# Patient Record
Sex: Male | Born: 1955 | Race: White | Hispanic: No | Marital: Married | Smoking: Former smoker
Health system: Southern US, Community
[De-identification: ages and names within clinical notes are randomized; demographics above are authoritative.]

## PROBLEM LIST (undated history)

## (undated) DIAGNOSIS — G2581 Restless legs syndrome: Secondary | ICD-10-CM

## (undated) DIAGNOSIS — E7800 Pure hypercholesterolemia, unspecified: Secondary | ICD-10-CM

## (undated) DIAGNOSIS — R519 Headache, unspecified: Secondary | ICD-10-CM

## (undated) DIAGNOSIS — K219 Gastro-esophageal reflux disease without esophagitis: Secondary | ICD-10-CM

## (undated) HISTORY — DX: Headache, unspecified: R51.9

## (undated) HISTORY — PX: HERNIA REPAIR: SHX51

## (undated) HISTORY — PX: BACK SURGERY: SHX140

## (undated) HISTORY — PX: SHOULDER SURGERY: SHX246

---

## 2007-11-17 ENCOUNTER — Encounter: Admission: RE | Admit: 2007-11-17 | Discharge: 2008-01-04 | Payer: Self-pay | Admitting: Unknown Physician Specialty

## 2009-10-30 ENCOUNTER — Encounter
Admission: RE | Admit: 2009-10-30 | Discharge: 2010-01-30 | Payer: Self-pay | Source: Home / Self Care | Admitting: Orthopedic Surgery

## 2010-01-31 ENCOUNTER — Encounter
Admission: RE | Admit: 2010-01-31 | Discharge: 2010-03-06 | Payer: Self-pay | Source: Home / Self Care | Attending: Orthopedic Surgery | Admitting: Orthopedic Surgery

## 2015-03-10 DIAGNOSIS — I639 Cerebral infarction, unspecified: Secondary | ICD-10-CM

## 2015-03-10 HISTORY — DX: Cerebral infarction, unspecified: I63.9

## 2015-06-27 ENCOUNTER — Encounter (HOSPITAL_COMMUNITY): Payer: Self-pay

## 2015-06-27 ENCOUNTER — Inpatient Hospital Stay (HOSPITAL_COMMUNITY)
Admission: EM | Admit: 2015-06-27 | Discharge: 2015-06-29 | DRG: 066 | Disposition: A | Discharge status: Home / Self Care | Payer: BLUE CROSS/BLUE SHIELD | Attending: Internal Medicine | Admitting: Internal Medicine

## 2015-06-27 ENCOUNTER — Emergency Department (HOSPITAL_COMMUNITY): Payer: BLUE CROSS/BLUE SHIELD

## 2015-06-27 DIAGNOSIS — R51 Headache: Secondary | ICD-10-CM

## 2015-06-27 DIAGNOSIS — I63532 Cerebral infarction due to unspecified occlusion or stenosis of left posterior cerebral artery: Secondary | ICD-10-CM | POA: Diagnosis not present

## 2015-06-27 DIAGNOSIS — R519 Headache, unspecified: Secondary | ICD-10-CM | POA: Diagnosis present

## 2015-06-27 DIAGNOSIS — F17210 Nicotine dependence, cigarettes, uncomplicated: Secondary | ICD-10-CM | POA: Diagnosis present

## 2015-06-27 DIAGNOSIS — H5461 Unqualified visual loss, right eye, normal vision left eye: Secondary | ICD-10-CM | POA: Diagnosis present

## 2015-06-27 DIAGNOSIS — H539 Unspecified visual disturbance: Secondary | ICD-10-CM

## 2015-06-27 DIAGNOSIS — I639 Cerebral infarction, unspecified: Secondary | ICD-10-CM | POA: Diagnosis present

## 2015-06-27 DIAGNOSIS — H53461 Homonymous bilateral field defects, right side: Secondary | ICD-10-CM | POA: Diagnosis present

## 2015-06-27 LAB — COMPREHENSIVE METABOLIC PANEL
ALBUMIN: 4.1 g/dL (ref 3.5–5.0)
ALT: 19 U/L (ref 17–63)
ANION GAP: 9 (ref 5–15)
AST: 18 U/L (ref 15–41)
Alkaline Phosphatase: 66 U/L (ref 38–126)
BILIRUBIN TOTAL: 0.3 mg/dL (ref 0.3–1.2)
BUN: 17 mg/dL (ref 6–20)
CHLORIDE: 105 mmol/L (ref 101–111)
CO2: 25 mmol/L (ref 22–32)
Calcium: 8.7 mg/dL — ABNORMAL LOW (ref 8.9–10.3)
Creatinine, Ser: 1.08 mg/dL (ref 0.61–1.24)
GFR calc Af Amer: >60 mL/min (ref >60)
GFR calc non Af Amer: >60 mL/min (ref >60)
GLUCOSE: 94 mg/dL (ref 65–99)
POTASSIUM: 3.8 mmol/L (ref 3.5–5.1)
Sodium: 139 mmol/L (ref 135–145)
TOTAL PROTEIN: 6.7 g/dL (ref 6.5–8.1)

## 2015-06-27 LAB — URINALYSIS, ROUTINE W REFLEX MICROSCOPIC
Bilirubin Urine: NEGATIVE
GLUCOSE, UA: NEGATIVE mg/dL
Hgb urine dipstick: NEGATIVE
Ketones, ur: NEGATIVE mg/dL
LEUKOCYTES UA: NEGATIVE
Nitrite: NEGATIVE
PH: 6 (ref 5.0–8.0)
Protein, ur: NEGATIVE mg/dL
Specific Gravity, Urine: <1.005 — ABNORMAL LOW (ref 1.005–1.030)

## 2015-06-27 LAB — CBC
HCT: 41.8 % (ref 39.0–52.0)
HEMOGLOBIN: 14.2 g/dL (ref 13.0–17.0)
MCH: 30.7 pg (ref 26.0–34.0)
MCHC: 34 g/dL (ref 30.0–36.0)
MCV: 90.3 fL (ref 78.0–100.0)
PLATELETS: 399 10*3/uL (ref 150–400)
RBC: 4.63 MIL/uL (ref 4.22–5.81)
RDW: 13.5 % (ref 11.5–15.5)
WBC: 9 10*3/uL (ref 4.0–10.5)

## 2015-06-27 LAB — I-STAT TROPONIN, ED: TROPONIN I, POC: 0 ng/mL (ref 0.00–0.08)

## 2015-06-27 LAB — I-STAT CHEM 8, ED
BUN: 17 mg/dL (ref 6–20)
Calcium, Ion: 1.07 mmol/L — ABNORMAL LOW (ref 1.12–1.23)
Chloride: 103 mmol/L (ref 101–111)
Creatinine, Ser: 1.1 mg/dL (ref 0.61–1.24)
Glucose, Bld: 89 mg/dL (ref 65–99)
HEMATOCRIT: 44 % (ref 39.0–52.0)
HEMOGLOBIN: 15 g/dL (ref 13.0–17.0)
POTASSIUM: 3.7 mmol/L (ref 3.5–5.1)
SODIUM: 140 mmol/L (ref 135–145)
TCO2: 23 mmol/L (ref 0–100)

## 2015-06-27 LAB — RAPID URINE DRUG SCREEN, HOSP PERFORMED
Amphetamines: NOT DETECTED
Barbiturates: NOT DETECTED
Benzodiazepines: NOT DETECTED
COCAINE: NOT DETECTED
OPIATES: POSITIVE — AB
TETRAHYDROCANNABINOL: NOT DETECTED

## 2015-06-27 LAB — ETHANOL

## 2015-06-27 LAB — DIFFERENTIAL
BASOS PCT: 0 %
Basophils Absolute: 0 10*3/uL (ref 0.0–0.1)
EOS ABS: 0.1 10*3/uL (ref 0.0–0.7)
EOS PCT: 1 %
LYMPHS ABS: 3.2 10*3/uL (ref 0.7–4.0)
Lymphocytes Relative: 35 %
Monocytes Absolute: 0.7 10*3/uL (ref 0.1–1.0)
Monocytes Relative: 8 %
NEUTROS PCT: 56 %
Neutro Abs: 5 10*3/uL (ref 1.7–7.7)

## 2015-06-27 LAB — PROTIME-INR
INR: 1.02 (ref 0.00–1.49)
PROTHROMBIN TIME: 13.6 s (ref 11.6–15.2)

## 2015-06-27 LAB — APTT: APTT: 25 s (ref 24–37)

## 2015-06-27 MED ORDER — IOHEXOL 350 MG/ML SOLN
75.0000 mL | Freq: Once | INTRAVENOUS | Status: AC | PRN
  Administered 2015-06-27: 75 mL via INTRAVENOUS

## 2015-06-27 MED ORDER — IOHEXOL 350 MG/ML SOLN: 100.0000 mL | Freq: Once | INTRAVENOUS | Status: DC | PRN

## 2015-06-27 MED ORDER — MORPHINE SULFATE (PF) 4 MG/ML IV SOLN
4.0000 mg | Freq: Once | INTRAVENOUS | Status: AC
  Administered 2015-06-27: 4 mg via INTRAVENOUS
  Filled 2015-06-27: qty 1

## 2015-06-27 MED ORDER — ONDANSETRON HCL 4 MG/2ML IJ SOLN
4.0000 mg | Freq: Once | INTRAMUSCULAR | Status: AC
  Administered 2015-06-27: 4 mg via INTRAVENOUS
  Filled 2015-06-27: qty 2

## 2015-06-27 NOTE — ED Notes (Signed)
MD at the bedside, wife and sister in law in the room

## 2015-06-27 NOTE — ED Notes (Signed)
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  He was taken to a nearby hospital and they did not do anything. He is weak, unsteady, and his vision is off.  Complaining of a headache.  Right side peripheral vision is gone. Stopped to take a break on Tuesday and I walked back to the truck and it happened all of a sudden.  They did a head CT, told me my potassium was low, and gave me fluids.  They sent a team out to pick up myself and my truck.

## 2015-06-27 NOTE — ED Provider Notes (Addendum)
CSN: [id number]     Arrival date & time 06/27/15  2052 History  By signing my name below, I, Helane Gunther, attest that this documentation has been prepared under the direction and in the presence of Tanna Furry, MD. Electronically Signed: Helane Gunther, ED Scribe. 06/27/2015. 9:40 PM.     Chief Complaint  Patient presents with  . Headache  . Visual Field Change   The history is provided by the patient, the spouse and a relative. No language interpreter was used.   HPI Comments: Russell Richard is a 60 y.o. male smoker at 1 ppd who presents to the Emergency Department complaining of a left-sided frontal headache and right-sided loss of vision onset 2 days ago. 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 He notes he "couldn't think" and dind't know where he was, except that he was in the truck. He states another truck driver came up to him. Per wife, pt called her and was rambling, unable to tell her where he was, but notes that pt is now back to normal with normal memory function. She notes she called the trucking company to find out where pt was. Pt then called EMS and was taken to the hospital by EMS and underwent extensive testing and imaging, after which he was discharged with a packet of medical papers and told his potassium was very low. He was taken back home by another driver from the same company and met his family in the ED parking lot. He denies any previous episodes of the same. He reports he has noticed a gait problem after loosing his vision on Tuesday. He denies a PMHx of HTN, DM, heart disease, stroke, and seizures. He does endorse a PSHx of back and shoulder surgery, and reports pain in the left shoulder at baseline. Pt denies nausea with either episode. He also denies neck or back pain.    History reviewed. No pertinent past medical history. Past Surgical History  Procedure  Laterality Date  . Back surgery    . Shoulder surgery     No family history on file. Social History  Substance Use Topics  . Smoking status: Current Every Day Smoker -- 1.00 packs/day    Types: Cigarettes  . Smokeless tobacco: None  . Alcohol Use: No    Review of Systems  Constitutional: Negative for chills, diaphoresis and appetite change.  HENT: Negative for mouth sores and trouble swallowing.   Eyes: Positive for visual disturbance.  Respiratory: Negative for chest tightness, shortness of breath and wheezing.   Cardiovascular: Negative for chest pain.  Gastrointestinal: Negative for nausea and abdominal distention.  Endocrine: Negative for polydipsia, polyphagia and polyuria.  Genitourinary: Negative for dysuria, frequency and hematuria.  Musculoskeletal: Positive for gait problem. Negative for back pain and neck pain.  Skin: Negative for color change, pallor and rash.  Neurological: Positive for headaches. Negative for syncope and light-headedness.  Hematological: Does not bruise/bleed easily.  Psychiatric/Behavioral: Positive for confusion (resolved). Negative for behavioral problems.    Allergies  Review of patient's allergies indicates no known allergies.  Home Medications   Prior to Admission medications   Medication Sig Start Date End Date Taking? Authorizing Provider  esomeprazole (NEXIUM) 20 MG capsule Take 20 mg by mouth 5 (five) times daily.    Yes Historical Provider, MD  OVER THE COUNTER MEDICATION Take  3 capsules by mouth daily. [place]   Yes Historical Provider, MD  traZODone (DESYREL) 50 MG tablet Take 50 mg by mouth at bedtime.   Yes Historical Provider, MD   BP 126/80 mmHg  Pulse 75  Temp(Src) 97.9 F (36.6 C) (Oral)  Resp 18  Ht _0  (1.803 m)  Wt 200 lb (90.719 kg)  BMI 27.91 kg/m2  SpO2 96% Physical Exam  Constitutional: He is oriented to person, place, and time. He appears well-developed and well-nourished. No distress.  HENT:  Head:  Normocephalic.  Eyes: Conjunctivae are normal. Pupils are equal, round, and reactive to light. No scleral icterus.  Right homonymous hemianopsia   Neck: Normal range of motion. Neck supple. No thyromegaly present.  Cardiovascular: Normal rate and regular rhythm.  Exam reveals no gallop and no friction rub.   No murmur heard. Pulmonary/Chest: Effort normal and breath sounds normal. No respiratory distress. He has no wheezes. He has no rales.  Abdominal: Soft. Bowel sounds are normal. He exhibits no distension. There is no tenderness. There is no rebound.  Musculoskeletal: Normal range of motion.  Neurological: He is alert and oriented to person, place, and time.  Skin: Skin is warm and dry. No rash noted.  Psychiatric: He has a normal mood and affect. His behavior is normal.    ED Course  Procedures  DIAGNOSTIC STUDIES: Oxygen Saturation is 98% on RA, normal by my interpretation.    COORDINATION OF CARE: 9:38 PM - Discussed normal EKG results and plans to repeat a CT scan. Will order something for pain and nausea. Advised pt he may need an MRI. Pt advised of plan for treatment and pt agrees.  Labs Review Labs Reviewed  COMPREHENSIVE METABOLIC PANEL - Abnormal; Notable for the following:    Calcium 8.7 (*)    All other components within normal limits  I-STAT CHEM 8, ED - Abnormal; Notable for the following:    Calcium, Ion 1.07 (*)    All other components within normal limits  ETHANOL  PROTIME-INR  APTT  CBC  DIFFERENTIAL  URINE RAPID DRUG SCREEN, HOSP PERFORMED  URINALYSIS, ROUTINE W REFLEX MICROSCOPIC (NOT AT [facility])  I-STAT TROPOININ, ED    Imaging Review Ct Head Wo Contrast  06/27/2015  CLINICAL DATA:  Headache and visual changes for 2 days, episode of confusion Tuesday, headaches since, unable to see to the RIGHT side, history smoking EXAM: CT HEAD WITHOUT CONTRAST TECHNIQUE: Contiguous axial images were obtained from the base of the skull through the vertex without  intravenous contrast. COMPARISON:  None FINDINGS: Normal ventricular morphology. No midline shift or mass effect. Large area of low-attenuation at the LEFT occipital lobe consistent with an acute to subacute infarct. No midline shift. No additional focal parenchymal brain abnormalities, hemorrhage, or mass lesion. Atherosclerotic calcification at the carotid siphons. Osseous structures unremarkable. Paranasal sinuses and mastoid air cells clear. IMPRESSION: Acute to subacute infarct involving the LEFT occipital lobe. Electronically Signed   By: Lavonia Dana M.D.   On: 06/27/2015 22:05   I have personally reviewed and evaluated these images and lab results as part of my medical decision-making.   EKG Interpretation None      MDM   Final diagnoses:  Cerebral infarction due to unspecified mechanism  Homonymous hemianopsia, right    CT shows left occipital large volume area of hypodensity consistent with infarct. This would explain his homonymous hemianopsia. CT angiogram of the head and neck ordered to rule out vertebrobasilar dissection considering the patient's headache.  Discussed the case with Dr. Shanon Brow. Also Dr. Wendee Beavers neurology [facility]. Dr. Wendee Beavers felt that if CT angiogram was normal, the patient could complete a stroke evaluation inpatient here, ie would not require specific studies at [facility].   I personally performed the services described in this documentation, which was scribed in my presence. The recorded information has been reviewed and is accurate.   Tanna Furry, MD 06/27/15 [place], MD 06/27/15 2337

## 2015-06-27 NOTE — ED Notes (Signed)
Pt has loss of peripheral vision on right, complaining of throbbing headache in fore head region. Other neuro check in tact

## 2015-06-28 ENCOUNTER — Observation Stay (HOSPITAL_COMMUNITY): Payer: BLUE CROSS/BLUE SHIELD

## 2015-06-28 ENCOUNTER — Encounter (HOSPITAL_COMMUNITY): Payer: Self-pay | Admitting: *Deleted

## 2015-06-28 DIAGNOSIS — H53461 Homonymous bilateral field defects, right side: Secondary | ICD-10-CM | POA: Diagnosis present

## 2015-06-28 DIAGNOSIS — I63532 Cerebral infarction due to unspecified occlusion or stenosis of left posterior cerebral artery: Secondary | ICD-10-CM | POA: Diagnosis present

## 2015-06-28 DIAGNOSIS — I6789 Other cerebrovascular disease: Secondary | ICD-10-CM

## 2015-06-28 DIAGNOSIS — F17210 Nicotine dependence, cigarettes, uncomplicated: Secondary | ICD-10-CM | POA: Diagnosis present

## 2015-06-28 DIAGNOSIS — H5461 Unqualified visual loss, right eye, normal vision left eye: Secondary | ICD-10-CM | POA: Diagnosis not present

## 2015-06-28 DIAGNOSIS — R519 Headache, unspecified: Secondary | ICD-10-CM | POA: Diagnosis present

## 2015-06-28 DIAGNOSIS — I639 Cerebral infarction, unspecified: Secondary | ICD-10-CM | POA: Diagnosis not present

## 2015-06-28 DIAGNOSIS — R51 Headache: Secondary | ICD-10-CM

## 2015-06-28 DIAGNOSIS — I63012 Cerebral infarction due to thrombosis of left vertebral artery: Secondary | ICD-10-CM | POA: Diagnosis not present

## 2015-06-28 DIAGNOSIS — I63032 Cerebral infarction due to thrombosis of left carotid artery: Secondary | ICD-10-CM

## 2015-06-28 LAB — LIPID PANEL
CHOL/HDL RATIO: 5.6 ratio
CHOLESTEROL: 169 mg/dL (ref 0–200)
HDL: 30 mg/dL — ABNORMAL LOW (ref >40)
LDL CALC: 84 mg/dL (ref 0–99)
TRIGLYCERIDES: 273 mg/dL — AB (ref <150)
VLDL: 55 mg/dL — AB (ref 0–40)

## 2015-06-28 LAB — GLUCOSE, CAPILLARY
GLUCOSE-CAPILLARY: 126 mg/dL — AB (ref 65–99)
Glucose-Capillary: 145 mg/dL — ABNORMAL HIGH (ref 65–99)
Glucose-Capillary: 92 mg/dL (ref 65–99)

## 2015-06-28 LAB — ECHOCARDIOGRAM COMPLETE
Height: 71 in
Weight: 3200 oz

## 2015-06-28 MED ORDER — STROKE: EARLY STAGES OF RECOVERY BOOK
Freq: Once | Status: DC
Start: 2015-06-28 — End: 2015-06-29
  Filled 2015-06-28: qty 1

## 2015-06-28 MED ORDER — ASPIRIN 300 MG RE SUPP: 300.0000 mg | Freq: Every day | RECTAL | Status: DC

## 2015-06-28 MED ORDER — ACETAMINOPHEN 325 MG PO TABS
650.0000 mg | ORAL_TABLET | Freq: Four times a day (QID) | ORAL | Status: DC | PRN
  Administered 2015-06-28 (×2): 650 mg via ORAL
  Filled 2015-06-28 (×2): qty 2

## 2015-06-28 MED ORDER — ASPIRIN 325 MG PO TABS
325.0000 mg | ORAL_TABLET | Freq: Every day | ORAL | Status: DC
  Administered 2015-06-28 – 2015-06-29 (×2): 325 mg via ORAL
  Filled 2015-06-28 (×2): qty 1

## 2015-06-28 MED ORDER — ASPIRIN 81 MG PO CHEW
324.0000 mg | CHEWABLE_TABLET | Freq: Once | ORAL | Status: AC
  Administered 2015-06-28: 324 mg via ORAL
  Filled 2015-06-28: qty 4

## 2015-06-28 MED ORDER — MORPHINE SULFATE (PF) 4 MG/ML IV SOLN
4.0000 mg | Freq: Once | INTRAVENOUS | Status: AC
  Administered 2015-06-28: 4 mg via INTRAVENOUS
  Filled 2015-06-28: qty 1

## 2015-06-28 NOTE — Care Management Note (Signed)
Case Management Note  Patient Details  Name: Russell Richard MRN: [id number] Date of Birth: February 10, 1956  Subjective/Objective:     From home and independent living with spouse.  Drives. CVA has presented with right Periferal vision loss.  Weakness in arms legs resolving. No recommendations for home PT or REHAB.  Insured patient that can afford meds. No Cm needs identified.           Action/Plan:Home with Self care.   Expected Discharge Date:  07/01/15               Expected Discharge Plan:  Home/Self Care  In-House Referral:     Discharge planning Services  CM Consult, Homebound not met per provider  Post Acute Care Choice:    Choice offered to:     DME Arranged:    DME Agency:     HH Arranged:    HH Agency:     Status of Service:  Completed, signed off  Medicare Important Message Given:    Date Medicare IM Given:    Medicare IM give by:    Date Additional Medicare IM Given:    Additional Medicare Important Message give by:     If discussed at [place] of Stay Meetings, dates discussed:    Additional Comments:  Alvie Heidelberg, RN 06/28/2015, 3:59 PM

## 2015-06-28 NOTE — ED Provider Notes (Signed)
1:38 AM  D/w Dr. Cena BentonVega with neurology. Discussed CTA imaging findings. States that there is nothing that can be done for the P3 occlusion seen. He agrees that patient can stay here at [facility]. Discussed with Dr. Onalee Huaavid with hospital service who agrees on admission to telemetry, observation. We'll give a full dose aspirin.  Layla MawKristen N Kla Bily, DO 06/28/15 0140

## 2015-06-28 NOTE — H&P (Signed)
PCP:   No primary care provider on file.   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  He pulled over the truck called his company who called 911.  He went to local ED there via ambulance.  Pt reports he was not admitted, a ct head was done which he was told was normal and he was sent home.  When he left there, he was still having right eye blindness and the headache.  He took a cab to his truck, his company sent someone to get him and he arrived back in [place] today.  He continued to be worried and his wife was as well, so they came to the ER here.  Pt reports he has been having some imbalance issues due to the new blindness in the right eye.  He does not take an aspirin daily and was not instructed to take aspirin by the ED 2 days ago.  He denies any facial drooping, no numbnness /tingling in the face or any extremeties.  No weakness in his arms/legs.  No numbness anywhere.  Headache is frontal and bilateral.  Pt ct head today shows acute infarct.  Pt referred for admission for CVA work up.  [facility] who recommended that there was nothing more aggressive that could be done [facility] than [facility] (cta shows left P3 occlusion).  So referred for neuro work up here at [facility].  Review of Systems:  Positive and negative as per HPI otherwise all other systems are negative  Past Medical History: History reviewed. No pertinent past medical history. Past Surgical History  Procedure Laterality Date  . Back surgery    . Shoulder surgery      Medications: Prior to Admission medications   Medication Sig Start Date End Date Taking? Authorizing Provider  esomeprazole (NEXIUM) 20 MG capsule Take 20 mg by mouth 5 (five) times daily.    Yes Historical  Provider, MD  OVER THE COUNTER MEDICATION Take 3 capsules by mouth daily. [place]EN[place]   Yes Historical Provider, MD  traZODone (DESYREL) 50 MG tablet Take 50 mg by mouth at bedtime.   Yes Historical Provider, MD    Allergies:  No Known Allergies  Social History:  reports that he has been smoking Cigarettes.  He has been smoking about 1.00 pack per day. He does not have any smokeless tobacco history on file. He reports that he does not drink alcohol or use illicit drugs.  Family History: No premature CAD  Physical Exam: Filed Vitals:   06/27/15 2335 06/28/15 0000 06/28/15 0030 06/28/15 0100  BP: 120/87 119/71 115/80 114/75  Pulse: 74 71 68 66  Temp:      TempSrc:      Resp: 13 18 14 16   Height:      Weight:      SpO2: 94% 95% 94% 94%   General appearance: alert, cooperative and no distress Head: Normocephalic, without obvious abnormality, atraumatic Eyes: negative Nose: Nares normal. Septum midline. Mucosa normal. No drainage or sinus tenderness. Neck: no JVD and supple, symmetrical, trachea midline Lungs: clear to auscultation bilaterally Heart: regular rate and rhythm, S1, S2 normal, no murmur, click, rub or gallop Abdomen: soft, non-tender; bowel sounds normal; no masses,  no organomegaly Extremities:  extremities normal, atraumatic, no cyanosis or edema Pulses: 2+ and symmetric Skin: Skin color, texture, turgor normal. No rashes or lesions Neurologic: Grossly normal   Labs on Admission:   Recent Labs  06/27/15 2120 06/27/15 2130  NA 139 140  K 3.8 3.7  CL 105 103  CO2 25  --   GLUCOSE 94 89  BUN 17 17  CREATININE 1.08 1.10  CALCIUM 8.7*  --     Recent Labs  06/27/15 2120  AST 18  ALT 19  ALKPHOS 66  BILITOT 0.3  PROT 6.7  ALBUMIN 4.1    Recent Labs  06/27/15 2120 06/27/15 2130  WBC 9.0  --   NEUTROABS 5.0  --   HGB 14.2 15.0  HCT 41.8 44.0  MCV 90.3  --   PLT 399  --     Radiological Exams on Admission: Ct Angio Head W/cm &/or Wo  Cm  06/27/2015  CLINICAL DATA:  Loss of RIGHT peripheral vision, throbbing frontal headache. Follow-up LEFT stroke. EXAM: CT ANGIOGRAPHY HEAD AND NECK TECHNIQUE: Multidetector CT imaging of the head and neck was performed using the standard protocol during bolus administration of intravenous contrast. Multiplanar CT image reconstructions and MIPs were obtained to evaluate the vascular anatomy. Carotid stenosis measurements (when applicable) are obtained utilizing NASCET criteria, using the distal internal carotid diameter as the denominator. CONTRAST:  75mL OMNIPAQUE IOHEXOL 350 MG/ML SOLN COMPARISON:  CT head June 27, 2015 at 2156 hours FINDINGS: CTA NECK Aortic arch: Normal appearance of the thoracic arch, normal branch pattern. Mild calcific atherosclerosis. The origins of the innominate, left Common carotid artery and subclavian artery are widely patent. Right carotid system: Common carotid artery is widely patent, coursing in a straight line fashion. Normal appearance of the carotid bifurcation without hemodynamically significant stenosis by NASCET criteria. Mild eccentric calcific atherosclerosis. Normal appearance of the included internal carotid artery. Tonsillar loop. Left carotid system: Common carotid artery is widely patent, coursing in a straight line fashion. Normal appearance of the carotid bifurcation without hemodynamically significant stenosis by NASCET criteria. Normal appearance of the included internal carotid artery. Vertebral arteries:Left vertebral artery is dominant. Eccentric calcific atherosclerosis results in mild narrowing RIGHT vertebral artery origin. Otherwise normal appearance of the vertebral arteries, which appear widely patent. Skeleton: No acute osseous process though bone windows have not been submitted. Scattered dental caries. Degenerative change of the cervical spine moderate to severe C5-6 and C6-7 neural foraminal narrowing. Other neck: Soft tissues of the neck are  non-acute though, not tailored for evaluation. CTA HEAD (venous contamination limits evaluation) Anterior circulation: Normal appearance of the cervical internal carotid arteries, petrous, cavernous and supra clinoid internal carotid arteries. Mild calcific atherosclerosis of the carotid siphons. Widely patent anterior communicating artery. Normal appearance of the anterior and middle cerebral arteries. Posterior circulation: Normal appearance of the vertebral arteries, vertebrobasilar junction and basilar artery, as well as main branch vessels. Suspected LEFT P3 segment occlusion though limited by venous contamination. No large vessel occlusion, hemodynamically significant stenosis, dissection, luminal irregularity, contrast extravasation or aneurysm within the anterior nor posterior circulation. No suspicious intracranial enhancement. Minimal enhancement of the LEFT occipital lobe infarct. IMPRESSION: CTA NECK: Mild calcific atherosclerosis resulting in mild stenosis RIGHT vertebral artery origin. No acute vascular process or hemodynamically significant stenosis. CTA HEAD: Suspected LEFT P3 occlusion though limited by mixed phase with venous contamination. No high-grade stenosis. Electronically Signed   By: Awilda Metro M.D.   On: 06/27/2015 23:23   Ct Head Wo Contrast  06/27/2015  CLINICAL DATA:  Headache and visual changes for 2 days, episode of confusion Tuesday, headaches since, unable to see to the RIGHT side, history smoking EXAM: CT HEAD WITHOUT CONTRAST TECHNIQUE: Contiguous axial images were obtained from the base of the skull through the vertex without intravenous contrast. COMPARISON:  None FINDINGS: Normal ventricular morphology. No midline shift or mass effect. Large area of low-attenuation at the LEFT occipital lobe consistent with an acute to subacute infarct. No midline shift. No additional focal parenchymal brain abnormalities, hemorrhage, or mass lesion. Atherosclerotic calcification at  the carotid siphons. Osseous structures unremarkable. Paranasal sinuses and mastoid air cells clear. IMPRESSION: Acute to subacute infarct involving the LEFT occipital lobe. Electronically Signed   By: Ulyses SouthwardMark  Boles M.D.   On: 06/27/2015 22:05   Ct Angio Neck W/cm &/or Wo/cm  06/27/2015  CLINICAL DATA:  Loss of RIGHT peripheral vision, throbbing frontal headache. Follow-up LEFT stroke. EXAM: CT ANGIOGRAPHY HEAD AND NECK TECHNIQUE: Multidetector CT imaging of the head and neck was performed using the standard protocol during bolus administration of intravenous contrast. Multiplanar CT image reconstructions and MIPs were obtained to evaluate the vascular anatomy. Carotid stenosis measurements (when applicable) are obtained utilizing NASCET criteria, using the distal internal carotid diameter as the denominator. CONTRAST:  75mL OMNIPAQUE IOHEXOL 350 MG/ML SOLN COMPARISON:  CT head June 27, 2015 at 2156 hours FINDINGS: CTA NECK Aortic arch: Normal appearance of the thoracic arch, normal branch pattern. Mild calcific atherosclerosis. The origins of the innominate, left Common carotid artery and subclavian artery are widely patent. Right carotid system: Common carotid artery is widely patent, coursing in a straight line fashion. Normal appearance of the carotid bifurcation without hemodynamically significant stenosis by NASCET criteria. Mild eccentric calcific atherosclerosis. Normal appearance of the included internal carotid artery. Tonsillar loop. Left carotid system: Common carotid artery is widely patent, coursing in a straight line fashion. Normal appearance of the carotid bifurcation without hemodynamically significant stenosis by NASCET criteria. Normal appearance of the included internal carotid artery. Vertebral arteries:Left vertebral artery is dominant. Eccentric calcific atherosclerosis results in mild narrowing RIGHT vertebral artery origin. Otherwise normal appearance of the vertebral arteries, which  appear widely patent. Skeleton: No acute osseous process though bone windows have not been submitted. Scattered dental caries. Degenerative change of the cervical spine moderate to severe C5-6 and C6-7 neural foraminal narrowing. Other neck: Soft tissues of the neck are non-acute though, not tailored for evaluation. CTA HEAD (venous contamination limits evaluation) Anterior circulation: Normal appearance of the cervical internal carotid arteries, petrous, cavernous and supra clinoid internal carotid arteries. Mild calcific atherosclerosis of the carotid siphons. Widely patent anterior communicating artery. Normal appearance of the anterior and middle cerebral arteries. Posterior circulation: Normal appearance of the vertebral arteries, vertebrobasilar junction and basilar artery, as well as main branch vessels. Suspected LEFT P3 segment occlusion though limited by venous contamination. No large vessel occlusion, hemodynamically significant stenosis, dissection, luminal irregularity, contrast extravasation or aneurysm within the anterior nor posterior circulation. No suspicious intracranial enhancement. Minimal enhancement of the LEFT occipital lobe infarct. IMPRESSION: CTA NECK: Mild calcific atherosclerosis resulting in mild stenosis RIGHT vertebral artery origin. No acute vascular process or hemodynamically significant stenosis. CTA HEAD: Suspected LEFT P3 occlusion though limited by mixed phase with venous contamination. No high-grade stenosis. Electronically Signed   By: Awilda Metroourtnay  Bloomer M.D.   On: 06/27/2015 23:23   ekg reviewed nsr no acute issues Case discussed with dr Fayrene Fearingjames and dr ward in ED  Assessment/Plan  60 yo  male with acute right eye blindness a little over 48 hours ago with new subacute left occipital lobe infarct and left P3 occlusion  Principal Problem:   CVA (cerebral infarction)-  Left occipital lobe infarct.   Place on daily aspirin.  Check hga1c and flp in am.  Obtain cardiac echo  and mri of brain.  Obtain neuro consultation.  Encouraged to stop smoking and seems motivated too.  Obtain PT/OT/ST eval.  Place on cva pathway.  Place on cardiac monitoring.     Active Problems:   Vision loss of right eye- due to cva   Headache- also likely due to stroke  obs on tele.  Full code.  Russell Richard A 06/28/2015, 1:50 AM

## 2015-06-28 NOTE — Progress Notes (Signed)
Patient seen and examined, database reviewed. Discussed with wife at bedside and all questions answered. Patient is admitted with right visual deficits and imbalance. He was found to have what appears to be an acute/subacute left occipital infarct on CT. Stroke workup is currently in progress including echo, Dopplers, therapy evaluations. Anticipate discharge home in 24 hours. We will continue to follow.  Peggye PittEstela Hernandez, MD [facility] Pager: [phone]

## 2015-06-28 NOTE — ED Notes (Signed)
Pt complaining of head

## 2015-06-28 NOTE — Evaluation (Addendum)
Physical Therapy Evaluation Patient Details Name: Natalia LeatherwoodMichael Wiens MRN: [id number] DOB: 07/20/1955 Today's Date: 06/28/2015   History of Present Illness  60 yo M admitted with R visual deficits and imbalance.  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  [place] and they sent him home.  Upon admission here at [facility], on CT he was found to have an acute/subacute L occipital infarct, and CTA shows L P3 occlusion.   Clinical Impression  PT spoke with RN re: orders written for PT to start tomorrow, however should be for today.  Pt received in bed, wife present, and pt was agreeable to PT evaluation.  Pt demonstrated all functional mobility at independent level, and ambulated 4100ft with no overt gait deviations.  Pt's main complaint continues to be R visual field deficit, but during mobility, he was able to scan his environment, and read signs in the hallway appropriately.  Pt able to find his way back to his room.  He scored a 55/56 on the BERG with only deficit with SLS on the R LE where he was only able to hold it for 7sec.  At this point, pt does not demonstrate need for skilled PT, therefore, will sign off at this time.      Follow Up Recommendations No PT follow up    Equipment Recommendations       Recommendations for Other Services       Precautions / Restrictions Precautions Precautions: None Restrictions Weight Bearing Restrictions: No      Mobility  Bed Mobility Overal bed mobility: Independent                Transfers Overall transfer level: Independent                  Ambulation/Gait Ambulation/Gait assistance: Independent Ambulation Distance (Feet): 400 Feet Assistive device: None Gait Pattern/deviations: Step-through pattern   Gait velocity interpretation: at or above normal speed for age/gender General Gait Details: Pt does not demonstrate any overt gait deviations.  Pt was  able to ambulate without any deviation with vertical and horizontal head turns.   Stairs            Wheelchair Mobility    Modified Rankin (Stroke Patients Only)       Balance Overall balance assessment: Independent                               Standardized Balance Assessment Standardized Balance Assessment : Berg Balance Test Berg Balance Test Sit to Stand: Able to stand without using hands and stabilize independently Standing Unsupported: Able to stand safely 2 minutes Sitting with Back Unsupported but Feet Supported on Floor or Stool: Able to sit safely and securely 2 minutes Stand to Sit: Sits safely with minimal use of hands Transfers: Able to transfer safely, minor use of hands Standing Unsupported with Eyes Closed: Able to stand 10 seconds safely Standing Ubsupported with Feet Together: Able to place feet together independently and stand 1 minute safely From Standing, Reach Forward with Outstretched Arm: Can reach confidently >25 cm (10") From Standing Position, Pick up Object from Floor: Able to pick up shoe safely and easily From Standing Position, Turn to Look Behind Over each Shoulder: Looks behind from both sides and weight shifts well Turn 360 Degrees: Able to turn 360 degrees safely in 4  seconds or less Standing Unsupported, Alternately Place Feet on Step/Stool: Able to stand independently and safely and complete 8 steps in 20 seconds Standing Unsupported, One Foot in Front: Able to place foot tandem independently and hold 30 seconds Standing on One Leg: Able to lift leg independently and hold 5-10 seconds Total Score: 55         Pertinent Vitals/Pain Pain Assessment: 0-10 Pain Score: 7  Pain Location: HA Pain Descriptors / Indicators: Aching    Home Living Family/patient expects to be discharged to:: Private residence Living Arrangements: Spouse/significant other   Type of Home: Mobile home Home Access: Stairs to enter Entrance  Stairs-Rails: Right Entrance Stairs-Number of Steps: 4 Home Layout: One level Home Equipment: Walker - standard      Prior Function Level of Independence: Independent         Comments: Pt was completely independent with ADL's and IADL's, still working as a [employer].       Hand Dominance   Dominant Hand: Right    Extremity/Trunk Assessment   Upper Extremity Assessment: Defer to OT evaluation           Lower Extremity Assessment: Overall WFL for tasks assessed         Communication   Communication: No difficulties  Cognition Arousal/Alertness: Awake/alert Behavior During Therapy: WFL for tasks assessed/performed Overall Cognitive Status: Within Functional Limits for tasks assessed                      General Comments      Exercises        Assessment/Plan    PT Assessment Patent does not need any further PT services  PT Diagnosis     PT Problem List    PT Treatment Interventions     PT Goals (Current goals can be found in the Care Plan section) Acute Rehab PT Goals Patient Stated Goal: Pt wants to go home and take a shower.  PT Goal Formulation: With patient/family Time For Goal Achievement: 06/28/15 Potential to Achieve Goals: Good    Frequency     Barriers to discharge        Co-evaluation               End of Session   Activity Tolerance: Patient tolerated treatment well Patient left: in bed;with call bell/phone within reach;with family/visitor present (sitting on the EOB) Nurse Communication: Mobility status         Time: 9604-5409 PT Time Calculation (min) (ACUTE ONLY): 23 min   Charges:   PT Evaluation $PT Eval Low Complexity: 1 Procedure PT Treatments $Neuromuscular Re-education: 8-22 mins   PT G Codes:        Beth Suellen Durocher, PT, DPT X: [id number]   06/28/2015, 10:19 AM

## 2015-06-28 NOTE — Progress Notes (Signed)
*  PRELIMINARY RESULTS* Echocardiogram 2D Echocardiogram has been performed.  Russell Richard, Russell Richard 06/28/2015, 4:16 PM

## 2015-06-29 DIAGNOSIS — I639 Cerebral infarction, unspecified: Secondary | ICD-10-CM

## 2015-06-29 DIAGNOSIS — I63012 Cerebral infarction due to thrombosis of left vertebral artery: Secondary | ICD-10-CM

## 2015-06-29 LAB — HEMOGLOBIN A1C
Hgb A1c MFr Bld: 5.4 % (ref 4.8–5.6)
Mean Plasma Glucose: 108 mg/dL

## 2015-06-29 MED ORDER — ASPIRIN EC 81 MG PO TBEC: 81.0000 mg | DELAYED_RELEASE_TABLET | Freq: Every day | ORAL | Status: AC

## 2015-06-29 MED ORDER — ROSUVASTATIN CALCIUM 20 MG PO TABS: 20.0000 mg | ORAL_TABLET | Freq: Every day | ORAL | Status: DC

## 2015-06-29 NOTE — Discharge Summary (Signed)
Physician Discharge Summary  Russell Richard ZOX:[id number] DOB: [birth year] DOA: 06/27/2015  PCP: No primary care provider on file.  Admit date: 06/27/2015 Discharge date: 06/29/2015  Time spent: 45 minutes  Recommendations for Outpatient Follow-up:  -We'll be discharged home today. -Advised to follow-up with primary care provider in 2 weeks.   Discharge Diagnoses:  Principal Problem:   CVA (cerebral infarction) Active Problems:   Vision loss of right eye   Headache   Acute CVA (cerebrovascular accident) [facility])   Discharge Condition: Stable and improved  Filed Weights   06/27/15 2103  Weight: 90.719 kg (200 lb)    History of present illness:  As per Dr. 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 He pulled over the truck called his company who called 911. He went to local ED there via ambulance. Pt reports he was not admitted, a ct head was done which he was told was normal and he was sent home. When he left there, he was still having right eye blindness and the headache. He took a cab to his truck, his company sent someone to get him and he arrived back in [place] today. He continued to be worried and his wife was as well, so they came to the ER here. Pt reports he has been having some imbalance issues due to the new blindness in the right eye. He does not take an aspirin daily and was not instructed to take aspirin by the ED 2 days ago. He denies any facial drooping, no numbnness /tingling in the face or any extremeties. No weakness in his arms/legs. No numbness anywhere. Headache is frontal and bilateral. Pt ct head today shows acute infarct. Pt referred for admission for CVA work up. [facility] who recommended that there was nothing more aggressive that  could be done at [facility] than [facility] (cta shows left P3 occlusion). So referred for neuro work up here at [facility].  Hospital Course:   Acute left PCA territory CVA -With resultant right visual defect. -Has been started on aspirin for secondary stroke prevention as well as a statin despite LDL being 84. -No therapy follow-up has been recommended. -2-D echo: Study Conclusions  - Left ventricle: The cavity size was normal. Wall thickness was  normal. Systolic function was normal. The estimated ejection  fraction was in the range of 55% to 60%. Wall motion was normal;  there were no regional wall motion abnormalities. Left  ventricular diastolic function parameters were normal. -Carotid Dopplers:IMPRESSION: Minimal amount of bilateral atherosclerotic plaque, right subjectively greater than left, not resulting in a hemodynamically significant stenosis within either internal carotid artery.   Procedures:  None   Consultations:  None  Discharge Instructions  Discharge Instructions    Diet - low sodium heart healthy    Complete by:  As directed      Increase activity slowly    Complete by:  As directed             Medication List    STOP taking these medications        esomeprazole 20 MG capsule  Commonly known as:  NEXIUM     OVER THE COUNTER MEDICATION     traZODone 50 MG tablet  Commonly known as:  DESYREL  TAKE these medications        aspirin EC 81 MG tablet  Take 1 tablet (81 mg total) by mouth daily.     rosuvastatin 20 MG tablet  Commonly known as:  CRESTOR  Take 1 tablet (20 mg total) by mouth daily.       No Known Allergies     Follow-up Information    Schedule an appointment as soon as possible for a visit in 2 weeks to follow up.   Why:  With her regular provider       The results of significant diagnostics from this hospitalization (including imaging, microbiology, ancillary and laboratory) are listed below for reference.     Significant Diagnostic Studies: Ct Angio Head W/cm &/or Wo Cm  06/27/2015  CLINICAL DATA:  Loss of RIGHT peripheral vision, throbbing frontal headache. Follow-up LEFT stroke. EXAM: CT ANGIOGRAPHY HEAD AND NECK TECHNIQUE: Multidetector CT imaging of the head and neck was performed using the standard protocol during bolus administration of intravenous contrast. Multiplanar CT image reconstructions and MIPs were obtained to evaluate the vascular anatomy. Carotid stenosis measurements (when applicable) are obtained utilizing NASCET criteria, using the distal internal carotid diameter as the denominator. CONTRAST:  75mL OMNIPAQUE IOHEXOL 350 MG/ML SOLN COMPARISON:  CT head June 27, 2015 at 2156 hours FINDINGS: CTA NECK Aortic arch: Normal appearance of the thoracic arch, normal branch pattern. Mild calcific atherosclerosis. The origins of the innominate, left Common carotid artery and subclavian artery are widely patent. Right carotid system: Common carotid artery is widely patent, coursing in a straight line fashion. Normal appearance of the carotid bifurcation without hemodynamically significant stenosis by NASCET criteria. Mild eccentric calcific atherosclerosis. Normal appearance of the included internal carotid artery. Tonsillar loop. Left carotid system: Common carotid artery is widely patent, coursing in a straight line fashion. Normal appearance of the carotid bifurcation without hemodynamically significant stenosis by NASCET criteria. Normal appearance of the included internal carotid artery. Vertebral arteries:Left vertebral artery is dominant. Eccentric calcific atherosclerosis results in mild narrowing RIGHT vertebral artery origin. Otherwise normal appearance of the vertebral arteries, which appear widely patent. Skeleton: No acute osseous process though bone windows have not been submitted. Scattered dental caries. Degenerative change of the cervical spine moderate to severe C5-6 and C6-7 neural  foraminal narrowing. Other neck: Soft tissues of the neck are non-acute though, not tailored for evaluation. CTA HEAD (venous contamination limits evaluation) Anterior circulation: Normal appearance of the cervical internal carotid arteries, petrous, cavernous and supra clinoid internal carotid arteries. Mild calcific atherosclerosis of the carotid siphons. Widely patent anterior communicating artery. Normal appearance of the anterior and middle cerebral arteries. Posterior circulation: Normal appearance of the vertebral arteries, vertebrobasilar junction and basilar artery, as well as main branch vessels. Suspected LEFT P3 segment occlusion though limited by venous contamination. No large vessel occlusion, hemodynamically significant stenosis, dissection, luminal irregularity, contrast extravasation or aneurysm within the anterior nor posterior circulation. No suspicious intracranial enhancement. Minimal enhancement of the LEFT occipital lobe infarct. IMPRESSION: CTA NECK: Mild calcific atherosclerosis resulting in mild stenosis RIGHT vertebral artery origin. No acute vascular process or hemodynamically significant stenosis. CTA HEAD: Suspected LEFT P3 occlusion though limited by mixed phase with venous contamination. No high-grade stenosis. Electronically Signed   By: Awilda Metroourtnay  Bloomer M.D.   On: 06/27/2015 23:23   Ct Head Wo Contrast  06/27/2015  CLINICAL DATA:  Headache and visual changes for 2 days, episode of confusion Tuesday, headaches since, unable to see to the RIGHT side, history smoking  EXAM: CT HEAD WITHOUT CONTRAST TECHNIQUE: Contiguous axial images were obtained from the base of the skull through the vertex without intravenous contrast. COMPARISON:  None FINDINGS: Normal ventricular morphology. No midline shift or mass effect. Large area of low-attenuation at the LEFT occipital lobe consistent with an acute to subacute infarct. No midline shift. No additional focal parenchymal brain abnormalities,  hemorrhage, or mass lesion. Atherosclerotic calcification at the carotid siphons. Osseous structures unremarkable. Paranasal sinuses and mastoid air cells clear. IMPRESSION: Acute to subacute infarct involving the LEFT occipital lobe. Electronically Signed   By: Ulyses Southward M.D.   On: 06/27/2015 22:05   Ct Angio Neck W/cm &/or Wo/cm  06/27/2015  CLINICAL DATA:  Loss of RIGHT peripheral vision, throbbing frontal headache. Follow-up LEFT stroke. EXAM: CT ANGIOGRAPHY HEAD AND NECK TECHNIQUE: Multidetector CT imaging of the head and neck was performed using the standard protocol during bolus administration of intravenous contrast. Multiplanar CT image reconstructions and MIPs were obtained to evaluate the vascular anatomy. Carotid stenosis measurements (when applicable) are obtained utilizing NASCET criteria, using the distal internal carotid diameter as the denominator. CONTRAST:  75mL OMNIPAQUE IOHEXOL 350 MG/ML SOLN COMPARISON:  CT head June 27, 2015 at 2156 hours FINDINGS: CTA NECK Aortic arch: Normal appearance of the thoracic arch, normal branch pattern. Mild calcific atherosclerosis. The origins of the innominate, left Common carotid artery and subclavian artery are widely patent. Right carotid system: Common carotid artery is widely patent, coursing in a straight line fashion. Normal appearance of the carotid bifurcation without hemodynamically significant stenosis by NASCET criteria. Mild eccentric calcific atherosclerosis. Normal appearance of the included internal carotid artery. Tonsillar loop. Left carotid system: Common carotid artery is widely patent, coursing in a straight line fashion. Normal appearance of the carotid bifurcation without hemodynamically significant stenosis by NASCET criteria. Normal appearance of the included internal carotid artery. Vertebral arteries:Left vertebral artery is dominant. Eccentric calcific atherosclerosis results in mild narrowing RIGHT vertebral artery origin.  Otherwise normal appearance of the vertebral arteries, which appear widely patent. Skeleton: No acute osseous process though bone windows have not been submitted. Scattered dental caries. Degenerative change of the cervical spine moderate to severe C5-6 and C6-7 neural foraminal narrowing. Other neck: Soft tissues of the neck are non-acute though, not tailored for evaluation. CTA HEAD (venous contamination limits evaluation) Anterior circulation: Normal appearance of the cervical internal carotid arteries, petrous, cavernous and supra clinoid internal carotid arteries. Mild calcific atherosclerosis of the carotid siphons. Widely patent anterior communicating artery. Normal appearance of the anterior and middle cerebral arteries. Posterior circulation: Normal appearance of the vertebral arteries, vertebrobasilar junction and basilar artery, as well as main branch vessels. Suspected LEFT P3 segment occlusion though limited by venous contamination. No large vessel occlusion, hemodynamically significant stenosis, dissection, luminal irregularity, contrast extravasation or aneurysm within the anterior nor posterior circulation. No suspicious intracranial enhancement. Minimal enhancement of the LEFT occipital lobe infarct. IMPRESSION: CTA NECK: Mild calcific atherosclerosis resulting in mild stenosis RIGHT vertebral artery origin. No acute vascular process or hemodynamically significant stenosis. CTA HEAD: Suspected LEFT P3 occlusion though limited by mixed phase with venous contamination. No high-grade stenosis. Electronically Signed   By: Awilda Metro M.D.   On: 06/27/2015 23:23   Mr Brain Wo Contrast  06/28/2015  CLINICAL DATA:  Headache and right-sided vision loss for the past couple of days. EXAM: MRI HEAD WITHOUT CONTRAST TECHNIQUE: Multiplanar, multiecho pulse sequences of the brain and surrounding structures were obtained without intravenous contrast. COMPARISON:  06/27/2015 CT and CTA  FINDINGS: Calvarium  and upper cervical spine: No focal marrow signal abnormality. Orbits: Negative. Sinuses and Mastoids: Clear. Brain: Acute left PCA territory infarct, large and confluent in the parasagittal left occipital lobe, measuring up to 5 cm in length. There is patchy lateral left occipital cortex infarct. Lacunar sized thalamoperforator infarct on the left. Expected cortical swelling without signs of hemorrhage. No other acute infarct. Few foci of peripheral hyperintensity in the right frontal lobe but no significant chronic ischemic injury. Given differences in technique there is no convincing progression from prior CT. Major intracranial vessels are patent based on T2 weighted appearance IMPRESSION: Acute, nonhemorrhagic left PCA territory infarct as described. Electronically Signed   By: Marnee Spring M.D.   On: 06/28/2015 11:55   US Carotid Bilateral  06/28/2015  CLINICAL DATA:  Severe frontal headache. Blurred vision. Altered mental status. EXAM: BILATERAL CAROTID DUPLEX ULTRASOUND TECHNIQUE: Wallace Cullens scale imaging, color Doppler and duplex ultrasound were performed of bilateral carotid and vertebral arteries in the neck. COMPARISON:  Brain MRI - 06/28/2015; head CT - 06/27/2015 FINDINGS: Criteria: Quantification of carotid stenosis is based on velocity parameters that correlate the residual internal carotid diameter with NASCET-based stenosis levels, using the diameter of the distal internal carotid lumen as the denominator for stenosis measurement. The following velocity measurements were obtained: RIGHT ICA:  81/34 cm/sec CCA:  97/ cm/sec SYSTOLIC ICA/CCA RATIO:  0.8 DIASTOLIC ICA/CCA RATIO:  1.7 ECA:  92 cm/sec LEFT ICA:  88/35 cm/sec CCA:  156/39 cm/sec SYSTOLIC ICA/CCA RATIO:  0.8 DIASTOLIC ICA/CCA RATIO:  1.3 ECA:  79 cm/sec RIGHT CAROTID ARTERY: There is a minimal amount of eccentric mixed echogenic plaque within the right carotid bulb (images 15 and 16), not resulting in elevated peak systolic velocities within  the interrogated course the right internal carotid artery to suggest a hemodynamically significant stenosis. RIGHT VERTEBRAL ARTERY:  Antegrade Flow LEFT CAROTID ARTERY: There is a minimal amount of extended mixed echogenic plaque within the left carotid bulb (image 55), not resulting in elevated peak systolic velocities within the interrogated course of the left internal carotid artery to suggest a hemodynamically significant stenosis. LEFT VERTEBRAL ARTERY:  Antegrade Flow IMPRESSION: Minimal amount of bilateral atherosclerotic plaque, right subjectively greater than left, not resulting in a hemodynamically significant stenosis within either internal carotid artery. Electronically Signed   By: Simonne Come M.D.   On: 06/28/2015 13:45    Microbiology: No results found for this or any previous visit (from the past 240 hour(s)).   Labs: Basic Metabolic Panel:  Recent Labs Lab 06/27/15 2120 06/27/15 2130  NA 139 140  K 3.8 3.7  CL 105 103  CO2 25  --   GLUCOSE 94 89  BUN 17 17  CREATININE 1.08 1.10  CALCIUM 8.7*  --    Liver Function Tests:  Recent Labs Lab 06/27/15 2120  AST 18  ALT 19  ALKPHOS 66  BILITOT 0.3  PROT 6.7  ALBUMIN 4.1   No results for input(s): LIPASE, AMYLASE in the last 168 hours. No results for input(s): AMMONIA in the last 168 hours. CBC:  Recent Labs Lab 06/27/15 2120 06/27/15 2130  WBC 9.0  --   NEUTROABS 5.0  --   HGB 14.2 15.0  HCT 41.8 44.0  MCV 90.3  --   PLT 399  --    Cardiac Enzymes: No results for input(s): CKTOTAL, CKMB, CKMBINDEX, TROPONINI in the last 168 hours. BNP: BNP (last 3 results) No results for input(s): BNP in the last 8760  hours.  ProBNP (last 3 results) No results for input(s): PROBNP in the last 8760 hours.  CBG:  Recent Labs Lab 06/28/15 1238 06/28/15 1634 06/28/15 2122  GLUCAP 145* 92 126*       Signed:  HERNANDEZ ACOSTA,ESTELA  [facility] Pager: [phone] 06/29/2015, 3:12 PM

## 2018-07-16 ENCOUNTER — Emergency Department (HOSPITAL_COMMUNITY)
Admission: EM | Admit: 2018-07-16 | Discharge: 2018-07-16 | Disposition: A | Discharge status: Home / Self Care | Payer: Medicare Other | Attending: Emergency Medicine | Admitting: Emergency Medicine

## 2018-07-16 ENCOUNTER — Encounter (HOSPITAL_COMMUNITY): Payer: Self-pay | Admitting: Emergency Medicine

## 2018-07-16 ENCOUNTER — Emergency Department (HOSPITAL_COMMUNITY): Payer: Medicare Other

## 2018-07-16 ENCOUNTER — Other Ambulatory Visit: Payer: Self-pay

## 2018-07-16 DIAGNOSIS — K8020 Calculus of gallbladder without cholecystitis without obstruction: Secondary | ICD-10-CM | POA: Diagnosis not present

## 2018-07-16 DIAGNOSIS — Z79899 Other long term (current) drug therapy: Secondary | ICD-10-CM | POA: Insufficient documentation

## 2018-07-16 DIAGNOSIS — Z8673 Personal history of transient ischemic attack (TIA), and cerebral infarction without residual deficits: Secondary | ICD-10-CM | POA: Insufficient documentation

## 2018-07-16 DIAGNOSIS — Z7982 Long term (current) use of aspirin: Secondary | ICD-10-CM | POA: Insufficient documentation

## 2018-07-16 DIAGNOSIS — R1031 Right lower quadrant pain: Secondary | ICD-10-CM | POA: Diagnosis present

## 2018-07-16 HISTORY — DX: Pure hypercholesterolemia, unspecified: E78.00

## 2018-07-16 LAB — CBC WITH DIFFERENTIAL/PLATELET
Abs Immature Granulocytes: 0.02 10*3/uL (ref 0.00–0.07)
Basophils Absolute: 0 10*3/uL (ref 0.0–0.1)
Basophils Relative: 1 %
Eosinophils Absolute: 0.2 10*3/uL (ref 0.0–0.5)
Eosinophils Relative: 2 %
HCT: 44.9 % (ref 39.0–52.0)
Hemoglobin: 15.7 g/dL (ref 13.0–17.0)
Immature Granulocytes: 0 %
Lymphocytes Relative: 16 %
Lymphs Abs: 1.3 10*3/uL (ref 0.7–4.0)
MCH: 32.4 pg (ref 26.0–34.0)
MCHC: 35 g/dL (ref 30.0–36.0)
MCV: 92.8 fL (ref 80.0–100.0)
Monocytes Absolute: 0.5 10*3/uL (ref 0.1–1.0)
Monocytes Relative: 5 %
Neutro Abs: 6.3 10*3/uL (ref 1.7–7.7)
Neutrophils Relative %: 76 %
Platelets: 449 10*3/uL — ABNORMAL HIGH (ref 150–400)
RBC: 4.84 MIL/uL (ref 4.22–5.81)
RDW: 13.5 % (ref 11.5–15.5)
WBC: 8.4 10*3/uL (ref 4.0–10.5)
nRBC: 0 % (ref 0.0–0.2)

## 2018-07-16 LAB — COMPREHENSIVE METABOLIC PANEL
ALT: 14 U/L (ref 0–44)
AST: 16 U/L (ref 15–41)
Albumin: 4.3 g/dL (ref 3.5–5.0)
Alkaline Phosphatase: 76 U/L (ref 38–126)
Anion gap: 11 (ref 5–15)
BUN: 12 mg/dL (ref 8–23)
CO2: 28 mmol/L (ref 22–32)
Calcium: 9.3 mg/dL (ref 8.9–10.3)
Chloride: 103 mmol/L (ref 98–111)
Creatinine, Ser: 0.9 mg/dL (ref 0.61–1.24)
GFR calc Af Amer: >60 mL/min (ref >60)
GFR calc non Af Amer: >60 mL/min (ref >60)
Glucose, Bld: 115 mg/dL — ABNORMAL HIGH (ref 70–99)
Potassium: 3.8 mmol/L (ref 3.5–5.1)
Sodium: 142 mmol/L (ref 135–145)
Total Bilirubin: 0.6 mg/dL (ref 0.3–1.2)
Total Protein: 7.3 g/dL (ref 6.5–8.1)

## 2018-07-16 LAB — URINALYSIS, ROUTINE W REFLEX MICROSCOPIC
Bilirubin Urine: NEGATIVE
Glucose, UA: NEGATIVE mg/dL
Hgb urine dipstick: NEGATIVE
Ketones, ur: NEGATIVE mg/dL
Leukocytes,Ua: NEGATIVE
Nitrite: NEGATIVE
Protein, ur: NEGATIVE mg/dL
Specific Gravity, Urine: 1.017 (ref 1.005–1.030)
pH: 7 (ref 5.0–8.0)

## 2018-07-16 LAB — LIPASE, BLOOD: Lipase: 74 U/L — ABNORMAL HIGH (ref 11–51)

## 2018-07-16 MED ORDER — HYDROCODONE-ACETAMINOPHEN 5-325 MG PO TABS: 2.0000 | ORAL_TABLET | ORAL | 0 refills | Status: DC | PRN

## 2018-07-16 MED ORDER — HYDROMORPHONE HCL 1 MG/ML IJ SOLN
0.5000 mg | Freq: Once | INTRAMUSCULAR | Status: AC
  Administered 2018-07-16: 08:00:00 0.5 mg via INTRAVENOUS
  Filled 2018-07-16: qty 1

## 2018-07-16 MED ORDER — IOHEXOL 300 MG/ML  SOLN
100.0000 mL | Freq: Once | INTRAMUSCULAR | Status: AC | PRN
  Administered 2018-07-16: 100 mL via INTRAVENOUS

## 2018-07-16 MED ORDER — SODIUM CHLORIDE 0.9 % IV BOLUS
500.0000 mL | Freq: Once | INTRAVENOUS | Status: AC
  Administered 2018-07-16: 500 mL via INTRAVENOUS

## 2018-07-16 MED ORDER — ONDANSETRON HCL 4 MG/2ML IJ SOLN
4.0000 mg | Freq: Once | INTRAMUSCULAR | Status: AC
  Administered 2018-07-16: 08:00:00 4 mg via INTRAVENOUS
  Filled 2018-07-16: qty 2

## 2018-07-16 MED ORDER — SODIUM CHLORIDE 0.9 % IV SOLN
INTRAVENOUS | Status: DC
  Administered 2018-07-16: 10:00:00 via INTRAVENOUS

## 2018-07-16 MED ORDER — HYDROMORPHONE HCL 1 MG/ML IJ SOLN
0.5000 mg | Freq: Once | INTRAMUSCULAR | Status: AC
  Administered 2018-07-16: 10:00:00 0.5 mg via INTRAVENOUS
  Filled 2018-07-16: qty 1

## 2018-07-16 NOTE — ED Provider Notes (Addendum)
[facility]ENCY [facility]PARTMENT Provider Note   CSN: [id number] Arrival date & time: 07/16/18  [id number]    History   Chief Complaint Chief Complaint  Patient presents with   Flank Pain    HPI Russell Richard is a 63 y.o. male.     Patient with acute onset at 3:30 in the morning of right-sided abdominal pain.  Patient states most intense right lower quadrant but moved up to right upper quadrant and over to the epigastric area.  Not associated with any nausea vomiting or diarrhea.  No blood in the urine.  No blood in the stools.  No prior history of similar pain.  No history of kidney stones.  No back pain.  Patient without any history of fever.  No upper respiratory symptoms.     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 Authorizing Provider  aspirin EC 81 MG tablet Take 1 tablet (81 mg total) by mouth daily. 06/29/15   Philip AspenHernandez Acosta, Limmie PatriciaEstela Y, MD  rosuvastatin (CRESTOR) 20 MG tablet Take 1 tablet (20 mg total) by mouth daily. 06/29/15   Philip AspenHernandez Acosta, Limmie PatriciaEstela Y, MD    Family History History reviewed. No pertinent family history.  Social History Social History   Tobacco Use   Smoking status: Former Smoker    Packs/day: 1.00    Types: Cigarettes    Last attempt to quit: 06/08/2018    Years since quitting: 0.1   Smokeless tobacco: Never Used  Substance Use Topics   Alcohol use: No   Drug use: No     Allergies   Patient has no known allergies.   Review of Systems Review of Systems  Constitutional: Negative for chills and  fever.  HENT: Negative for congestion, rhinorrhea and sore throat.   Eyes: Negative for visual disturbance.  Respiratory: Negative for cough and shortness of breath.   Cardiovascular: Negative for chest pain and leg swelling.  Gastrointestinal: Positive for abdominal pain. Negative for blood in stool, diarrhea, nausea and vomiting.  Genitourinary: Negative for dysuria and hematuria.  Musculoskeletal: Negative for back pain and neck pain.  Skin: Negative for rash.  Neurological: Negative for dizziness, light-headedness and headaches.  Hematological: Does not bruise/bleed easily.  Psychiatric/Behavioral: Negative for confusion.     Physical Exam Updated Vital Signs BP 128/78    Pulse 67    Temp 97.7 F (36.5 C) (Oral)    Resp 16    Ht 1.803 m (5\' 11" )    Wt 86.2 kg    SpO2 97%    BMI 26.50 kg/m   Physical Exam Vitals signs and nursing note reviewed.  Constitutional:      General: He is not in acute distress.    Appearance: He is well-developed.  HENT:     Head: Normocephalic and atraumatic.  Eyes:     Extraocular Movements: Extraocular movements intact.     Conjunctiva/sclera: Conjunctivae normal.     Pupils: Pupils are equal, round, and reactive to light.  Neck:  Musculoskeletal: Normal range of motion and neck supple.  Cardiovascular:     Rate and Rhythm: Normal rate and regular rhythm.     Heart sounds: Normal heart sounds. No murmur.  Pulmonary:     Effort: Pulmonary effort is normal. No respiratory distress.     Breath sounds: Normal breath sounds. No wheezing.  Abdominal:     Palpations: Abdomen is soft.     Tenderness: There is abdominal tenderness. There is guarding.  Musculoskeletal: Normal range of motion.        General: No swelling.  Skin:    General: Skin is warm and dry.     Capillary Refill: Capillary refill takes less than 2 seconds.  Neurological:     General: No focal deficit present.     Mental Status: He is alert and oriented to person, place,  and time.      ED Treatments / Results  Labs (all labs ordered are listed, but only abnormal results are displayed) Labs Reviewed  LIPASE, BLOOD - Abnormal; Notable for the following components:      Result Value   Lipase 74 (*)    All other components within normal limits  COMPREHENSIVE METABOLIC PANEL - Abnormal; Notable for the following components:   Glucose, Bld 115 (*)    All other components within normal limits  CBC WITH DIFFERENTIAL/PLATELET - Abnormal; Notable for the following components:   Platelets 449 (*)    All other components within normal limits  URINALYSIS, ROUTINE W REFLEX MICROSCOPIC    EKG None  Radiology Ct Abdomen Pelvis W Contrast  Result Date: 07/16/2018 CLINICAL DATA:  RIGHT-sided abdominal pain EXAM: CT ABDOMEN AND PELVIS WITH CONTRAST TECHNIQUE: Multidetector CT imaging of the abdomen and pelvis was performed using the standard protocol following bolus administration of intravenous contrast. CONTRAST:  OMNIPAQUE IOHEXOL 300 MG/ML  SOLN COMPARISON:  None. FINDINGS: Lower chest: Lung bases are clear. Hepatobiliary: No focal hepatic lesion. The several round gallstones in lumen of the gallbladder. No biliary duct dilatation. No inflammation. Pancreas: Pancreas is normal. No ductal dilatation. No pancreatic inflammation. Spleen: Normal spleen Adrenals/urinary tract: Adrenal glands and kidneys are normal. The ureters and bladder normal. Stomach/Bowel: Stomach, small-bowel and cecum are normal. The appendix is not identified but there is no pericecal inflammation to suggest appendicitis. The colon and rectosigmoid colon are normal. Vascular/Lymphatic: Abdominal aorta is normal caliber with atherosclerotic calcification. Common origin of the SMA and celiac trunk. There is no retroperitoneal or periportal lymphadenopathy. No pelvic lymphadenopathy. Reproductive: Prostate normal Other: No free fluid. Musculoskeletal: Posterior lumbar fusion.  No complicating  features IMPRESSION: 1. No acute findings in the abdomen pelvis. 2. Gallstones without evidence of cholecystitis. 3. Appendix not identified.  No secondary signs of appendicitis. 4.  Aortic Atherosclerosis (ICD10-I70.0). Electronically Signed   By: Genevive Bi M.D.   On: 07/16/2018 09:10    Procedures Procedures (including critical care time)  Medications Ordered in ED Medications  0.9 %  sodium chloride infusion ( Intravenous New Bag/Given 07/16/18 0933)  sodium chloride 0.9 % bolus 500 mL (0 mLs Intravenous Stopped 07/16/18 0932)  ondansetron (ZOFRAN) injection 4 mg (4 mg Intravenous Given 07/16/18 0825)  HYDROmorphone (DILAUDID) injection 0.5 mg (0.5 mg Intravenous Given 07/16/18 0827)  iohexol (OMNIPAQUE) 300 MG/ML solution 100 mL (100 mLs Intravenous Contrast Given 07/16/18 0844)  HYDROmorphone (DILAUDID) injection 0.5 mg (0.5 mg Intravenous Given 07/16/18 0937)     Initial Impression / Assessment and Plan / ED Course  I have reviewed  the triage vital signs and the nursing notes.  Pertinent labs & imaging results that were available during my care of the patient were reviewed by me and considered in my medical decision making (see chart for details).       Patient with acute onset of right-sided abdominal pain.  Very tender right lower quadrant.  With some guarding.  Also with some tenderness in right upper quadrant.  Patient has been a smoker recently quit.  Patient does not drink alcohol.  No prior history of similar pain.  Labs no leukocytosis but lipase is elevated total bili and rest of liver function test without any significant abnormalities.  Electrolytes without significant abnormality.  Lipase is mildly elevated.  We will get CT scan abdomen and pelvis with contrast to further evaluate the abdominal pain.  Pain the location and the tenderness is atypical for kidney stone.  More concerned about appendicitis or perhaps biliary stones.  CT scan shows biliary stones gallstones no  evidence of acute cholecystitis.  No biliary duct dilatations.  Patient symptoms probably prolonged biliary colic.  Reevaluated patient tenderness is improving but still slightly tender right side of the abdomen.  Will re-dose with second dose of hydromorphone.  And then reevaluate.  Patient most likely will be able to go home with follow-up from general surgery.  For consideration for outpatient elective gallbladder surgery.  Final Clinical Impressions(s) / ED Diagnoses   Final diagnoses:  Right lower quadrant abdominal pain  Calculus of gallbladder without cholecystitis without obstruction    ED Discharge Orders    None       Vanetta Mulders, MD 07/16/18 0945    Vanetta Mulders, MD 07/16/18 [phone]  Addendum: Patient with significant improvement.  No right lower quadrant tenderness at time of discharge.  Some minimal right upper quadrant tenderness.  No guarding.  Patient stable for discharge home symptoms consistent with symptomatic gallstone disease will give referral to general surgery for follow-up for consultation for elective cholecystectomy.  Patient will return for fever persistent vomiting or pain lasting 3 hours or longer.      Vanetta Mulders, MD 07/16/18 1037

## 2018-07-16 NOTE — Discharge Instructions (Addendum)
Take the pain medicine as needed.  Try to avoid fatty foods is much as possible.  Call general surgery for follow-up.  You have symptomatic gallstone disease and will need to have your gallbladder removed electively.  Return for fevers persistent vomiting or pain lasting 3 hours or longer.  This can lead to the gallbladder becoming infected.

## 2018-07-16 NOTE — ED Triage Notes (Signed)
Pt reports sudden onset right flank pain coming around to front of abdomen at 300 this morning. No n/v/d or hematuria.

## 2018-07-18 MED FILL — Hydrocodone-Acetaminophen Tab 5-325 MG: ORAL | Qty: 6 | Status: AC

## 2018-07-21 ENCOUNTER — Ambulatory Visit (INDEPENDENT_AMBULATORY_CARE_PROVIDER_SITE_OTHER): Payer: Medicare Other | Admitting: General Surgery

## 2018-07-21 ENCOUNTER — Encounter: Payer: Self-pay | Admitting: General Surgery

## 2018-07-21 ENCOUNTER — Other Ambulatory Visit: Payer: Self-pay

## 2018-07-21 VITALS — BP 120/83 | HR 79 | Temp 98.2°F | Resp 16 | Ht 71.0 in | Wt 183.0 lb

## 2018-07-21 DIAGNOSIS — K8020 Calculus of gallbladder without cholecystitis without obstruction: Secondary | ICD-10-CM

## 2018-07-21 MED ORDER — HYDROCODONE-ACETAMINOPHEN 5-325 MG PO TABS: 1.0000 | ORAL_TABLET | ORAL | 0 refills | Status: DC | PRN

## 2018-07-21 NOTE — Progress Notes (Signed)
[facility] History and Physical  Reason for Referral: Cholelithiasis  Referring Physician: ED [place]      Chief Complaint    Pre-op Exam      Russell Richard is a 63 y.o. male.  HPI: Russell Richard is a 63 yo who presented to the ED with RUQ pain that started in the middle of the night and woke him up from sleeping. He had no associated nausea/vomiting, but he felt the pain in his RUQ and into his back. He could not get any relief, and went to the hospital where he was worked up and found to have gallstones without any signs of infection or obstruction. His labs were wnl.  He had improvement in his pain after 2 doses of IV pain medication, and was sent home with a Rx for Norco pending his appointment. He says that in the last 5 days he has had 1 attack last night that required him to take a Norco. He says that this improved his pain. He did not have any nausea or vomiting with this pain. He does not recall eating anything fatty, spicy, or greasy and says he air fries everything since his wife's heart attack a few weeks ago.   The only other new symptom that he has noticed in the last few weeks is some minor bleeding with BMs. 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 He only describes some bleeding now. He has never had a colonoscopy or cologuard test.   During COVID he has been staying at home and has no sick contacts. He needs to take his wife to a heart doctor appointment on 08/08/2018.       Past Medical History:  Diagnosis Date  . CVA (cerebral vascular accident) (HCC) 2017   residual right peripheral vision loss  . Headache   . Hypercholesteremia          Past Surgical History:  Procedure Laterality Date  . BACK SURGERY    . SHOULDER SURGERY     No history of colon cancer reported.       Family History  Problem Relation Age of Onset  . CAD Mother   . Colon  cancer Neg Hx     Social History        Tobacco Use  . Smoking status: Former Smoker    Packs/day: 1.00    Types: Cigarettes    Last attempt to quit: 06/08/2018    Years since quitting: 0.1  . Smokeless tobacco: Never Used  Substance Use Topics  . Alcohol use: No  . Drug use: No    Medications: I have reviewed the patient's current medications. Allergies as of 07/21/2018   No Known Allergies        Medication List       Accurate as of Jul 21, 2018 11:59 PM. If you have any questions, ask your nurse or doctor.        aspirin EC 81 MG tablet Take 1 tablet (81 mg total) by mouth daily.   esomeprazole 40 MG capsule Commonly known as:  NEXIUM Take 1 capsule by mouth 2 (two) times a day.   fenofibrate micronized 43 MG capsule Commonly known as:  ANTARA Take 1 capsule by mouth daily.   gabapentin 300 MG capsule Commonly known as:  NEURONTIN Take 2 capsules by mouth 2 (two) times a day.   HYDROcodone-acetaminophen 5-325 MG   tablet Commonly known as:  NORCO/VICODIN Take 1-2 tablets by mouth every 4 (four) hours as needed for severe pain. What changed:    how much to take  reasons to take this Changed by:  Gregori Abril C Raileigh Sabater, MD   rosuvastatin 20 MG tablet Commonly known as:  Crestor Take 1 tablet (20 mg total) by mouth daily.   SUMAtriptan 100 MG tablet Commonly known as:  IMITREX Take 1 tablet by mouth daily as needed.   traZODone 50 MG tablet Commonly known as:  DESYREL Take 1-2 tablets by mouth every evening.        ROS:  A comprehensive review of systems was negative except for: Gastrointestinal: positive for abdominal pain and bloody BMs  No new weight loss No chest pain or shortness of breath  Blood pressure 120/83, pulse 79, temperature 98.2 F (36.8 C), temperature source Oral, resp. rate 16, height 5' 11" (1.803 m), weight 183 lb (83 kg), SpO2 94 %. Physical Exam Vitals signs reviewed.  Constitutional:       Appearance: Normal appearance.  HENT:     Head: Normocephalic.     Nose: Nose normal.     Mouth/Throat:     Mouth: Mucous membranes are moist.  Eyes:     Pupils: Pupils are equal, round, and reactive to light.  Neck:     Musculoskeletal: Normal range of motion.  Cardiovascular:     Rate and Rhythm: Normal rate and regular rhythm.  Pulmonary:     Effort: Pulmonary effort is normal.     Breath sounds: Normal breath sounds.  Abdominal:     General: There is no distension.     Palpations: Abdomen is soft.     Tenderness: There is abdominal tenderness in the right upper quadrant and epigastric area. There is no guarding or rebound.  Musculoskeletal: Normal range of motion.        General: No swelling.  Skin:    General: Skin is warm and dry.  Neurological:     General: No focal deficit present.     Mental Status: He is alert and oriented to person, place, and time.  Psychiatric:        Mood and Affect: Mood normal.        Behavior: Behavior normal.        Thought Content: Thought content normal.     Results: CT personally reviewed - gallbladder with calcified stones, no biliary dilation  CLINICAL DATA: RIGHT-sided abdominal pain  EXAM: CT ABDOMEN AND PELVIS WITH CONTRAST  TECHNIQUE: Multidetector CT imaging of the abdomen and pelvis was performed using the standard protocol following bolus administration of intravenous contrast.  CONTRAST: 100mL OMNIPAQUE IOHEXOL 300 MG/ML SOLN  COMPARISON: None.  FINDINGS: Lower chest: Lung bases are clear.  Hepatobiliary: No focal hepatic lesion. The several round gallstones in lumen of the gallbladder. No biliary duct dilatation. No inflammation.  Pancreas: Pancreas is normal. No ductal dilatation. No pancreatic inflammation.  Spleen: Normal spleen  Adrenals/urinary tract: Adrenal glands and kidneys are normal. The ureters and bladder normal.  Stomach/Bowel: Stomach, small-bowel and cecum are normal. The  appendix is not identified but there is no pericecal inflammation to suggest appendicitis. The colon and rectosigmoid colon are normal.  Vascular/Lymphatic: Abdominal aorta is normal caliber with atherosclerotic calcification. Common origin of the SMA and celiac trunk. There is no retroperitoneal or periportal lymphadenopathy. No pelvic lymphadenopathy.  Reproductive: Prostate normal  Other: No free fluid.  Musculoskeletal: Posterior lumbar fusion. No complicating features    IMPRESSION: 1. No acute findings in the abdomen pelvis. 2. Gallstones without evidence of cholecystitis. 3. Appendix not identified. No secondary signs of appendicitis. 4. Aortic Atherosclerosis (ICD10-I70.0).  Assessment & Plan:  Russell Richard is a 63 y.o. male with cholelithiasis and continued symptoms. He is able to eat and drink but is still having some pain.  This is improved with the norco when he gets a bad attack. He was given 6 norcos at the ED and has 4 remaining.  Due to his need to take his wife to her appointment we cannot get him on the schedule until 08/12/2018 due to COVID testing and isolation after testing.   -PLAN: I counseled the patient about the indication, risks and benefits of laparoscopic cholecystectomy.  He understands there is a very small chance for bleeding, infection, injury to normal structures (including common bile duct), conversion to open surgery, persistent symptoms, evolution of postcholecystectomy diarrhea, need for secondary interventions, anesthesia reaction, cardiopulmonary issues and other risks not specifically detailed here. I described the expected recovery, the plan for follow-up and the restrictions during the recovery phase.  All questions were answered.  -COVID testing explained to the patient -Colonoscopy referral with Dr. Rehman given no prior screening and some BRBPR noted in the last few weeks  -Refill for Norco printed for the patient (15 tablets) if he  were to need to fill it, he will shred otherwise  -Patient to call or go to the ED with worsening symptoms between now and 6/5   All questions were answered to the satisfaction of the patient.   Kaci Dillie C Radames Mejorado 07/22/2018, 7:30 AM     

## 2018-07-21 NOTE — Patient Instructions (Signed)
Cholelithiasis  Cholelithiasis is also called "gallstones." It is a kind of gallbladder disease. The gallbladder is an organ that stores a liquid (bile) that helps you digest fat. Gallstones may not cause symptoms (may be silent gallstones) until they cause a blockage, and then they can cause pain (gallbladder attack). Follow these instructions at home:  Take over-the-counter and prescription medicines only as told by your doctor.  Stay at a healthy weight.  Eat healthy foods. This includes: ? Eating fewer fatty foods, like fried foods. ? Eating fewer refined carbs (refined carbohydrates). Refined carbs are breads and grains that are highly processed, like white bread and white rice. Instead, choose whole grains like whole-wheat bread and brown rice. ? Eating more fiber. Almonds, fresh fruit, and beans are healthy sources of fiber.  Keep all follow-up visits as told by your doctor. This is important. Contact a doctor if:  You have sudden pain in the upper right side of your belly (abdomen). Pain might spread to your right shoulder or your chest. This may be a sign of a gallbladder attack.  You feel sick to your stomach (are nauseous).  You throw up (vomit).  You have been diagnosed with gallstones that have no symptoms and you get: ? Belly pain. ? Discomfort, burning, or fullness in the upper part of your belly (indigestion). Get help right away if:  You have sudden pain in the upper right side of your belly, and it lasts for more than 2 hours.  You have belly pain that lasts for more than 5 hours.  You have a fever or chills.  You keep feeling sick to your stomach or you keep throwing up.  Your skin or the whites of your eyes turn yellow (jaundice).  You have dark-colored pee (urine).  You have light-colored poop (stool). Summary  Cholelithiasis is also called "gallstones."  The gallbladder is an organ that stores a liquid (bile) that helps you digest fat.  Silent  gallstones are gallstones that do not cause symptoms.  A gallbladder attack may cause sudden pain in the upper right side of your belly. Pain might spread to your right shoulder or your chest. If this happens, contact your doctor.  If you have sudden pain in the upper right side of your belly that lasts for more than 2 hours, get help right away. This information is not intended to replace advice given to you by your health care provider. Make sure you discuss any questions you have with your health care provider. Document Released: 08/12/2007 Document Revised: 11/10/2015 Document Reviewed: 11/10/2015 Elsevier Interactive Patient Education  2019 [employer]   Laparoscopic Cholecystectomy Laparoscopic cholecystectomy is surgery to remove the gallbladder. The gallbladder is a pear-shaped organ that lies beneath the liver on the right side of the body. The gallbladder stores bile, which is a fluid that helps the body to digest fats. Cholecystectomy is often done for inflammation of the gallbladder (cholecystitis). This condition is usually caused by a buildup of gallstones (cholelithiasis) in the gallbladder. Gallstones can block the flow of bile, which can result in inflammation and pain. In severe cases, emergency surgery may be required. This procedure is done though small incisions in your abdomen (laparoscopic surgery). A thin scope with a camera (laparoscope) is inserted through one incision. Thin surgical instruments are inserted through the other incisions. In some cases, a laparoscopic procedure may be turned into a type of surgery that is done through a larger incision (open surgery). Tell a health care provider  about:  Any allergies you have.  All medicines you are taking, including vitamins, herbs, eye drops, creams, and over-the-counter medicines.  Any problems you or family members have had with anesthetic medicines.  Any blood disorders you have.  Any surgeries you have had.   Any medical conditions you have.  Whether you are pregnant or may be pregnant. What are the risks? Generally, this is a safe procedure. However, problems may occur, including:  Infection.  Bleeding.  Allergic reactions to medicines.  Damage to other structures or organs.  A stone remaining in the common bile duct. The common bile duct carries bile from the gallbladder into the small intestine.  A bile leak from the cyst duct that is clipped when your gallbladder is removed. Medicines  Ask your health care provider about: ? Changing or stopping your regular medicines. This is especially important if you are taking diabetes medicines or blood thinners. ? Taking medicines such as aspirin and ibuprofen. These medicines can thin your blood. Do not take these medicines before your procedure if your health care provider instructs you not to.  You may be given antibiotic medicine to help prevent infection. General instructions  Let your health care provider know if you develop a cold or an infection before surgery.  Plan to have someone take you home from the hospital or clinic.  Ask your health care provider how your surgical site will be marked or identified. What happens during the procedure?   To reduce your risk of infection: ? Your health care team will wash or sanitize their hands. ? Your skin will be washed with soap. ? Hair may be removed from the surgical area.  An IV tube may be inserted into one of your veins.  You will be given one or more of the following: ? A medicine to help you relax (sedative). ? A medicine to make you fall asleep (general anesthetic).  A breathing tube will be placed in your mouth.  Your surgeon will make several small cuts (incisions) in your abdomen.  The laparoscope will be inserted through one of the small incisions. The camera on the laparoscope will send images to a TV screen (monitor) in the operating room. This lets your surgeon see  inside your abdomen.  Air-like gas will be pumped into your abdomen. This will expand your abdomen to give the surgeon more room to perform the surgery.  Other tools that are needed for the procedure will be inserted through the other incisions. The gallbladder will be removed through one of the incisions.  Your common bile duct may be examined. If stones are found in the common bile duct, they may be removed.  After your gallbladder has been removed, the incisions will be closed with stitches (sutures), staples, or skin glue.  Your incisions may be covered with a bandage (dressing). The procedure may vary among health care providers and hospitals. What happens after the procedure?  Your blood pressure, heart rate, breathing rate, and blood oxygen level will be monitored until the medicines you were given have worn off.  You will be given medicines as needed to control your pain.  Do not drive for 24 hours if you were given a sedative. This information is not intended to replace advice given to you by your health care provider. Make sure you discuss any questions you have with your health care provider. Document Released: 02/23/2005 Document Revised: 01/21/2017 Document Reviewed: 08/12/2015 Elsevier Interactive Patient Education  2019 [employer].

## 2018-07-22 ENCOUNTER — Encounter: Payer: Self-pay | Admitting: General Surgery

## 2018-07-22 DIAGNOSIS — K8020 Calculus of gallbladder without cholecystitis without obstruction: Secondary | ICD-10-CM

## 2018-07-22 NOTE — H&P (Signed)
[facility] History and Physical  Reason for Referral: Cholelithiasis  Referring Physician: ED Jeani Hawking      Chief Complaint    Pre-op Exam      Russell Richard is a 63 y.o. male.  HPI: Russell Richard is a 63 yo who presented to the ED with RUQ pain that started in the middle of the night and woke him up from sleeping. He had no associated nausea/vomiting, but he felt the pain in his RUQ and into his back. He could not get any relief, and went to the hospital where he was worked up and found to have gallstones without any signs of infection or obstruction. His labs were wnl.  He had improvement in his pain after 2 doses of IV pain medication, and was sent home with a Rx for Norco pending his appointment. He says that in the last 5 days he has had 1 attack last night that required him to take a Norco. He says that this improved his pain. He did not have any nausea or vomiting with this pain. He does not recall eating anything fatty, spicy, or greasy and says he air fries everything since his wife's heart attack a few weeks ago.   The only other new symptom that he has noticed in the last few weeks is some minor bleeding with BMs. 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 He only describes some bleeding now. He has never had a colonoscopy or cologuard test.   During COVID he has been staying at home and has no sick contacts. He needs to take his wife to a heart doctor appointment on 08/08/2018.       Past Medical History:  Diagnosis Date  . CVA (cerebral vascular accident) (HCC) 2017   residual right peripheral vision loss  . Headache   . Hypercholesteremia          Past Surgical History:  Procedure Laterality Date  . BACK SURGERY    . SHOULDER SURGERY     No history of colon cancer reported.       Family History  Problem Relation Age of Onset  . CAD Mother   . Colon  cancer Neg Hx     Social History        Tobacco Use  . Smoking status: Former Smoker    Packs/day: 1.00    Types: Cigarettes    Last attempt to quit: 06/08/2018    Years since quitting: 0.1  . Smokeless tobacco: Never Used  Substance Use Topics  . Alcohol use: No  . Drug use: No    Medications: I have reviewed the patient's current medications. Allergies as of 07/21/2018   No Known Allergies        Medication List       Accurate as of Jul 21, 2018 11:59 PM. If you have any questions, ask your nurse or doctor.        aspirin EC 81 MG tablet Take 1 tablet (81 mg total) by mouth daily.   esomeprazole 40 MG capsule Commonly known as:  NEXIUM Take 1 capsule by mouth 2 (two) times a day.   fenofibrate micronized 43 MG capsule Commonly known as:  ANTARA Take 1 capsule by mouth daily.   gabapentin 300 MG capsule Commonly known as:  NEURONTIN Take 2 capsules by mouth 2 (two) times a day.   HYDROcodone-acetaminophen 5-325 MG  tablet Commonly known as:  NORCO/VICODIN Take 1-2 tablets by mouth every 4 (four) hours as needed for severe pain. What changed:    how much to take  reasons to take this Changed by:  Russell Roers, MD   rosuvastatin 20 MG tablet Commonly known as:  Crestor Take 1 tablet (20 mg total) by mouth daily.   SUMAtriptan 100 MG tablet Commonly known as:  IMITREX Take 1 tablet by mouth daily as needed.   traZODone 50 MG tablet Commonly known as:  DESYREL Take 1-2 tablets by mouth every evening.        ROS:  A comprehensive review of systems was negative except for: Gastrointestinal: positive for abdominal pain and bloody BMs  No new weight loss No chest pain or shortness of breath  Blood pressure 120/83, pulse 79, temperature 98.2 F (36.8 C), temperature source Oral, resp. rate 16, height  (1.803 m), weight 183 lb (83 kg), SpO2 94 %. Physical Exam Vitals signs reviewed.  Constitutional:       Appearance: Normal appearance.  HENT:     Head: Normocephalic.     Nose: Nose normal.     Mouth/Throat:     Mouth: Mucous membranes are moist.  Eyes:     Pupils: Pupils are equal, round, and reactive to light.  Neck:     Musculoskeletal: Normal range of motion.  Cardiovascular:     Rate and Rhythm: Normal rate and regular rhythm.  Pulmonary:     Effort: Pulmonary effort is normal.     Breath sounds: Normal breath sounds.  Abdominal:     General: There is no distension.     Palpations: Abdomen is soft.     Tenderness: There is abdominal tenderness in the right upper quadrant and epigastric area. There is no guarding or rebound.  Musculoskeletal: Normal range of motion.        General: No swelling.  Skin:    General: Skin is warm and dry.  Neurological:     General: No focal deficit present.     Mental Status: He is alert and oriented to person, place, and time.  Psychiatric:        Mood and Affect: Mood normal.        Behavior: Behavior normal.        Thought Content: Thought content normal.     Results: CT personally reviewed - gallbladder with calcified stones, no biliary dilation  CLINICAL DATA: RIGHT-sided abdominal pain  EXAM: CT ABDOMEN AND PELVIS WITH CONTRAST  TECHNIQUE: Multidetector CT imaging of the abdomen and pelvis was performed using the standard protocol following bolus administration of intravenous contrast.  CONTRAST: OMNIPAQUE IOHEXOL 300 MG/ML SOLN  COMPARISON: None.  FINDINGS: Lower chest: Lung bases are clear.  Hepatobiliary: No focal hepatic lesion. The several round gallstones in lumen of the gallbladder. No biliary duct dilatation. No inflammation.  Pancreas: Pancreas is normal. No ductal dilatation. No pancreatic inflammation.  Spleen: Normal spleen  Adrenals/urinary tract: Adrenal glands and kidneys are normal. The ureters and bladder normal.  Stomach/Bowel: Stomach, small-bowel and cecum are normal. The  appendix is not identified but there is no pericecal inflammation to suggest appendicitis. The colon and rectosigmoid colon are normal.  Vascular/Lymphatic: Abdominal aorta is normal caliber with atherosclerotic calcification. Common origin of the SMA and celiac trunk. There is no retroperitoneal or periportal lymphadenopathy. No pelvic lymphadenopathy.  Reproductive: Prostate normal  Other: No free fluid.  Musculoskeletal: Posterior lumbar fusion. No complicating features  IMPRESSION: 1. No acute findings in the abdomen pelvis. 2. Gallstones without evidence of cholecystitis. 3. Appendix not identified. No secondary signs of appendicitis. 4. Aortic Atherosclerosis (ICD10-I70.0).  Assessment & Plan:  Russell LeatherwoodMichael Richard is a 63 y.o. male with cholelithiasis and continued symptoms. He is able to eat and drink but is still having some pain.  This is improved with the norco when he gets a bad attack. He was given 6 norcos at the ED and has 4 remaining.  Due to his need to take his wife to her appointment we cannot get him on the schedule until 08/12/2018 due to COVID testing and isolation after testing.   -PLAN: I counseled the patient about the indication, risks and benefits of laparoscopic cholecystectomy.  He understands there is a very small chance for bleeding, infection, injury to normal structures (including common bile duct), conversion to open surgery, persistent symptoms, evolution of postcholecystectomy diarrhea, need for secondary interventions, anesthesia reaction, cardiopulmonary issues and other risks not specifically detailed here. I described the expected recovery, the plan for follow-up and the restrictions during the recovery phase.  All questions were answered.  -COVID testing explained to the patient -Colonoscopy referral with Russell Richard given no prior screening and some BRBPR noted in the last few weeks  -Refill for Norco printed for the patient (15 tablets) if he  were to need to fill it, he will shred otherwise  -Patient to call or go to the ED with worsening symptoms between now and 6/5   All questions were answered to the satisfaction of the patient.   Russell RoersLindsay C Offie Richard 07/22/2018, 7:30 AM

## 2018-07-25 ENCOUNTER — Encounter (INDEPENDENT_AMBULATORY_CARE_PROVIDER_SITE_OTHER): Payer: Self-pay | Admitting: *Deleted

## 2018-08-03 NOTE — Patient Instructions (Signed)
Russell LeatherwoodMichael Richard  08/03/2018     @PREFPERIOPPHARMACY @   Your procedure is scheduled on  08/12/2018.  [name]  [facility].  Call this number if you have problems the morning of surgery:  [phone]   Remember:  Do not eat or drink after midnight.                       Take these medicines the morning of surgery with A SIP OF WATER nexium, gabapentin, hydrocodone(if needed), imitrex.    Do not wear jewelry, make-up or nail polish.  Do not wear lotions, powders, or perfumes, or deodorant.  Do not shave 48 hours prior to surgery.  Men may shave face and neck.  Do not bring valuables to the hospital.  [facility] belongings or [facility]uables.  Contacts, dentures or bridgework may not be worn into surgery.  Leave your suitcase in the car.  After surgery it may be brought to your room.  For patients admitted to the hospital, discharge time will be determined by your treatment team.  Patients discharged the day of surgery will not be allowed to drive home.   Name and phone number of your driver:   Family Special instructions:  None  Please read over the following fact sheets that you were given. Anesthesia Post-op Instructions and Care and Recovery After Surgery       Laparoscopic Cholecystectomy, Care After This sheet gives you information about how to care for yourself after your procedure. Your health care provider may also give you more specific instructions. If you have problems or questions, contact your health care provider. What can I expect after the procedure? After the procedure, it is common to have:  Pain at your incision sites. You will be given medicines to control this pain.  Mild nausea or vomiting.  Bloating and possible shoulder pain from the air-like gas that was used during the procedure. Follow these instructions at home: Incision care   Follow instructions from your health care provider about how to  take care of your incisions. Make sure you: ? Wash your hands with soap and water before you change your bandage (dressing). If soap and water are not available, use hand sanitizer. ? Change your dressing as told by your health care provider. ? Leave stitches (sutures), skin glue, or adhesive strips in place. These skin closures may need to be in place for 2 weeks or longer. If adhesive strip edges start to loosen and curl up, you may trim the loose edges. Do not remove adhesive strips completely unless your health care provider tells you to do that.  Do not take baths, swim, or use a hot tub until your health care provider approves. Ask your health care provider if you can take showers. You may only be allowed to take sponge baths for bathing.  Check your incision area every day for signs of infection. Check for: ? More redness, swelling, or pain. ? More fluid or blood. ? Warmth. ? Pus or a bad smell. Activity  Do not drive or use heavy machinery while taking prescription pain medicine.  Do not lift anything that is heavier than 10 lb (4.5 kg) until your health care provider approves.  Do not play contact sports until your health care provider approves.  Do not drive for 24 hours if you were given a medicine to help you relax (  sedative).  Rest as needed. Do not return to work or school until your health care provider approves. General instructions  Take over-the-counter and prescription medicines only as told by your health care provider.  To prevent or treat constipation while you are taking prescription pain medicine, your health care provider may recommend that you: ? Drink enough fluid to keep your urine clear or pale yellow. ? Take over-the-counter or prescription medicines. ? Eat foods that are high in fiber, such as fresh fruits and vegetables, whole grains, and beans. ? Limit foods that are high in fat and processed sugars, such as fried and sweet foods. Contact a health care  provider if:  You develop a rash.  You have more redness, swelling, or pain around your incisions.  You have more fluid or blood coming from your incisions.  Your incisions feel warm to the touch.  You have pus or a bad smell coming from your incisions.  You have a fever.  One or more of your incisions breaks open. Get help right away if:  You have trouble breathing.  You have chest pain.  You have increasing pain in your shoulders.  You faint or feel dizzy when you stand.  You have severe pain in your abdomen.  You have nausea or vomiting that lasts for more than one day.  You have leg pain. This information is not intended to replace advice given to you by your health care provider. Make sure you discuss any questions you have with your health care provider. Document Released: 02/23/2005 Document Revised: 09/14/2015 Document Reviewed: 08/12/2015 Elsevier Interactive Patient Education  2019 [employer]  General Anesthesia, Adult, Care After This sheet gives you information about how to care for yourself after your procedure. Your health care provider may also give you more specific instructions. If you have problems or questions, contact your health care provider. What can I expect after the procedure? After the procedure, the following side effects are common:  Pain or discomfort at the IV site.  Nausea.  Vomiting.  Sore throat.  Trouble concentrating.  Feeling cold or chills.  Weak or tired.  Sleepiness and fatigue.  Soreness and body aches. These side effects can affect parts of the body that were not involved in surgery. Follow these instructions at home:  For at least 24 hours after the procedure:  Have a responsible adult stay with you. It is important to have someone help care for you until you are awake and alert.  Rest as needed.  Do not: ? Participate in activities in which you could fall or become injured. ? Drive. ? Use heavy  machinery. ? Drink alcohol. ? Take sleeping pills or medicines that cause drowsiness. ? Make important decisions or sign legal documents. ? Take care of children on your own. Eating and drinking  Follow any instructions from your health care provider about eating or drinking restrictions.  When you feel hungry, start by eating small amounts of foods that are soft and easy to digest (bland), such as toast. Gradually return to your regular diet.  Drink enough fluid to keep your urine pale yellow.  If you vomit, rehydrate by drinking water, juice, or clear broth. General instructions  If you have sleep apnea, surgery and certain medicines can increase your risk for breathing problems. Follow instructions from your health care provider about wearing your sleep device: ? Anytime you are sleeping, including during daytime naps. ? While taking prescription pain medicines, sleeping medicines, or medicines that  make you drowsy.  Return to your normal activities as told by your health care provider. Ask your health care provider what activities are safe for you.  Take over-the-counter and prescription medicines only as told by your health care provider.  If you smoke, do not smoke without supervision.  Keep all follow-up visits as told by your health care provider. This is important. Contact a health care provider if:  You have nausea or vomiting that does not get better with medicine.  You cannot eat or drink without vomiting.  You have pain that does not get better with medicine.  You are unable to pass urine.  You develop a skin rash.  You have a fever.  You have redness around your IV site that gets worse. Get help right away if:  You have difficulty breathing.  You have chest pain.  You have blood in your urine or stool, or you vomit blood. Summary  After the procedure, it is common to have a sore throat or nausea. It is also common to feel tired.  Have a responsible  adult stay with you for the first 24 hours after general anesthesia. It is important to have someone help care for you until you are awake and alert.  When you feel hungry, start by eating small amounts of foods that are soft and easy to digest (bland), such as toast. Gradually return to your regular diet.  Drink enough fluid to keep your urine pale yellow.  Return to your normal activities as told by your health care provider. Ask your health care provider what activities are safe for you. This information is not intended to replace advice given to you by your health care provider. Make sure you discuss any questions you have with your health care provider. Document Released: 06/01/2000 Document Revised: 10/09/2016 Document Reviewed: 10/09/2016 Elsevier Interactive Patient Education  2019 [employer] How to Use Chlorhexidine Before Surgery Chlorhexidine gluconate (CHG) is a germ-killing (antiseptic) solution that is used to clean the skin. It gets rid of the bacteria that normally live on the skin. Cleaning your skin with CHG before surgery helps lower the risk for infection after surgery. To clean your skin before surgery, you may be given:  A CHG solution to use in the shower.  A prepackaged cloth that contains CHG. What are the risks? Risks of using CHG include:  A skin reaction.  Hearing loss, if CHG gets in your ears.  Eye injury, if CHG gets in your eyes and is not rinsed out.  The CHG product catching fire. Make sure that you avoid smoking and flames after applying CHG to your skin. Do not use CHG:  If you have a chlorhexidine allergy or have previously reacted to chlorhexidine.  On babies younger than 43 months of age. How to use CHG solution   Use CHG only as told by your health care provider, and follow the instructions on the label.  Use CHG solution while taking a shower. Follow these steps when using CHG solution (unless your health care provider gives you  different instructions): 1. Start the shower. 2. Use your normal soap and shampoo to wash your face and hair. 3. Turn off the shower or move out of the shower stream. 4. Pour the CHG onto a clean washcloth. Do not use any type of brush or rough-edged sponge. 5. Starting at your neck, lather your body down to your toes. Make sure you:  Pay special attention to the part of your  body where you will be having surgery. Scrub this area for at least 1 minute.  Use the full amount of CHG as directed. Usually, this is one bottle.  Do not use CHG on your head or face. If the solution gets into your ears or eyes, rinse them well with water.  Avoid your genital area.  Avoid any areas of skin that have broken skin, cuts, or scrapes.  Scrub your back and under your arms. Make sure to wash skin folds. 6. Let the lather sit on your skin for 1-2 minutes or as long as told by your health care provider. 7. Thoroughly rinse your entire body in the shower. Make sure that all body creases and crevices are rinsed well. 8. Dry off with a clean towel. Do not put any substances on your body afterward, such as powder, lotion, or perfume. 9. Put on clean clothes or pajamas. 10. If it is the night before your surgery, sleep in clean sheets. How to use CHG prepackaged cloths   Only use CHG cloths as told by your health care provider, and follow the instructions on the label.  Use the CHG cloth on clean, dry skin. Follow these steps when using a CHG cloth (unless your health care provider gives you different instructions): 1. Using the CHG cloth, vigorously scrub the part of your body where you will be having surgery. Scrub using a back-and-forth motion for 3 minutes. The area on your body should be completely wet with CHG when you are done scrubbing. 2. Do not rinse. Discard the cloth and let the area air-dry for 1 minute. Do not put any substances on your body afterward, such as powder, lotion, or perfume. 3. Put  on clean clothes or pajamas. 4. If it is the night before your surgery, sleep in clean sheets. Contact a health care provider if:  Your skin gets irritated after scrubbing.  You have questions about using your solution or cloth. Get help right away if:  Your eyes become very red or swollen.  Your eyes itch badly.  Your skin itches badly and is red or swollen.  Your hearing changes.  You have trouble seeing.  You have swelling or tingling in your mouth or throat.  You have trouble breathing.  You swallow any chlorhexidine. Summary  Chlorhexidine gluconate (CHG) is a germ-killing (antiseptic) solution that is used to clean the skin. Cleaning your skin with CHG before surgery helps lower the risk for infection after surgery.  You may be given CHG to use at home. It may be in a bottle or in a prepackaged cloth to use on your skin. Carefully follow your health care provider's instructions and the instructions on the product label.  Do not use CHG if you have a chlorhexidine allergy.  Contact your health care provider if your skin gets irritated after scrubbing. This information is not intended to replace advice given to you by your health care provider. Make sure you discuss any questions you have with your health care provider. Document Released: 11/18/2011 Document Revised: 01/21/2017 Document Reviewed: 01/21/2017 Elsevier Interactive Patient Education  2019 [employer].

## 2018-08-09 ENCOUNTER — Other Ambulatory Visit (HOSPITAL_COMMUNITY)
Admission: RE | Admit: 2018-08-09 | Discharge: 2018-08-09 | Disposition: A | Discharge status: Home / Self Care | Payer: Medicare Other | Source: Ambulatory Visit | Attending: General Surgery | Admitting: General Surgery

## 2018-08-09 ENCOUNTER — Encounter (HOSPITAL_COMMUNITY): Payer: Self-pay

## 2018-08-09 ENCOUNTER — Encounter (HOSPITAL_COMMUNITY)
Admission: RE | Admit: 2018-08-09 | Discharge: 2018-08-09 | Disposition: A | Discharge status: Home / Self Care | Payer: Medicare Other | Source: Ambulatory Visit | Attending: General Surgery | Admitting: General Surgery

## 2018-08-09 ENCOUNTER — Other Ambulatory Visit: Payer: Self-pay

## 2018-08-09 DIAGNOSIS — I700 Atherosclerosis of aorta: Secondary | ICD-10-CM | POA: Diagnosis not present

## 2018-08-09 DIAGNOSIS — R51 Headache: Secondary | ICD-10-CM | POA: Diagnosis not present

## 2018-08-09 DIAGNOSIS — K8010 Calculus of gallbladder with chronic cholecystitis without obstruction: Secondary | ICD-10-CM | POA: Diagnosis present

## 2018-08-09 DIAGNOSIS — Z8673 Personal history of transient ischemic attack (TIA), and cerebral infarction without residual deficits: Secondary | ICD-10-CM | POA: Diagnosis not present

## 2018-08-09 DIAGNOSIS — Z1159 Encounter for screening for other viral diseases: Secondary | ICD-10-CM | POA: Insufficient documentation

## 2018-08-09 DIAGNOSIS — Z79899 Other long term (current) drug therapy: Secondary | ICD-10-CM | POA: Diagnosis not present

## 2018-08-09 DIAGNOSIS — Z01818 Encounter for other preprocedural examination: Secondary | ICD-10-CM | POA: Insufficient documentation

## 2018-08-09 DIAGNOSIS — E7800 Pure hypercholesterolemia, unspecified: Secondary | ICD-10-CM | POA: Diagnosis not present

## 2018-08-09 DIAGNOSIS — K219 Gastro-esophageal reflux disease without esophagitis: Secondary | ICD-10-CM | POA: Diagnosis not present

## 2018-08-09 DIAGNOSIS — Z8249 Family history of ischemic heart disease and other diseases of the circulatory system: Secondary | ICD-10-CM | POA: Diagnosis not present

## 2018-08-09 DIAGNOSIS — K8020 Calculus of gallbladder without cholecystitis without obstruction: Secondary | ICD-10-CM | POA: Insufficient documentation

## 2018-08-09 DIAGNOSIS — Z87891 Personal history of nicotine dependence: Secondary | ICD-10-CM | POA: Diagnosis not present

## 2018-08-09 HISTORY — DX: Restless legs syndrome: G25.81

## 2018-08-09 HISTORY — DX: Gastro-esophageal reflux disease without esophagitis: K21.9

## 2018-08-10 LAB — NOVEL CORONAVIRUS, NAA (HOSP ORDER, SEND-OUT TO REF LAB; TAT 18-24 HRS): SARS-CoV-2, NAA: NOT DETECTED

## 2018-08-12 ENCOUNTER — Encounter (HOSPITAL_COMMUNITY)
Admission: RE | Disposition: A | Discharge status: Home / Self Care | Payer: Self-pay | Source: Home / Self Care | Attending: General Surgery

## 2018-08-12 ENCOUNTER — Other Ambulatory Visit: Payer: Self-pay

## 2018-08-12 ENCOUNTER — Ambulatory Visit (HOSPITAL_COMMUNITY): Payer: Medicare Other | Admitting: Anesthesiology

## 2018-08-12 ENCOUNTER — Ambulatory Visit (HOSPITAL_COMMUNITY)
Admission: RE | Admit: 2018-08-12 | Discharge: 2018-08-12 | Disposition: A | Discharge status: Home / Self Care | Payer: Medicare Other | Attending: General Surgery | Admitting: General Surgery

## 2018-08-12 ENCOUNTER — Encounter (HOSPITAL_COMMUNITY): Payer: Self-pay | Admitting: Anesthesiology

## 2018-08-12 DIAGNOSIS — Z8673 Personal history of transient ischemic attack (TIA), and cerebral infarction without residual deficits: Secondary | ICD-10-CM | POA: Insufficient documentation

## 2018-08-12 DIAGNOSIS — E7800 Pure hypercholesterolemia, unspecified: Secondary | ICD-10-CM | POA: Insufficient documentation

## 2018-08-12 DIAGNOSIS — Z87891 Personal history of nicotine dependence: Secondary | ICD-10-CM | POA: Insufficient documentation

## 2018-08-12 DIAGNOSIS — K8020 Calculus of gallbladder without cholecystitis without obstruction: Secondary | ICD-10-CM | POA: Diagnosis not present

## 2018-08-12 DIAGNOSIS — K219 Gastro-esophageal reflux disease without esophagitis: Secondary | ICD-10-CM | POA: Insufficient documentation

## 2018-08-12 DIAGNOSIS — Z1159 Encounter for screening for other viral diseases: Secondary | ICD-10-CM | POA: Diagnosis not present

## 2018-08-12 DIAGNOSIS — I700 Atherosclerosis of aorta: Secondary | ICD-10-CM | POA: Insufficient documentation

## 2018-08-12 DIAGNOSIS — Z79899 Other long term (current) drug therapy: Secondary | ICD-10-CM | POA: Insufficient documentation

## 2018-08-12 DIAGNOSIS — K8010 Calculus of gallbladder with chronic cholecystitis without obstruction: Secondary | ICD-10-CM | POA: Diagnosis not present

## 2018-08-12 DIAGNOSIS — R51 Headache: Secondary | ICD-10-CM | POA: Insufficient documentation

## 2018-08-12 DIAGNOSIS — Z8249 Family history of ischemic heart disease and other diseases of the circulatory system: Secondary | ICD-10-CM | POA: Insufficient documentation

## 2018-08-12 HISTORY — PX: CHOLECYSTECTOMY: SHX55

## 2018-08-12 SURGERY — LAPAROSCOPIC CHOLECYSTECTOMY
Anesthesia: General

## 2018-08-12 MED ORDER — FENTANYL CITRATE (PF) 100 MCG/2ML IJ SOLN
INTRAMUSCULAR | Status: DC | PRN
  Administered 2018-08-12: 100 ug via INTRAVENOUS
  Administered 2018-08-12: 50 ug via INTRAVENOUS
  Administered 2018-08-12: 100 ug via INTRAVENOUS
  Administered 2018-08-12 (×2): 50 ug via INTRAVENOUS

## 2018-08-12 MED ORDER — ONDANSETRON HCL 4 MG PO TABS: 4.0000 mg | ORAL_TABLET | Freq: Every day | ORAL | 1 refills | Status: AC | PRN

## 2018-08-12 MED ORDER — SODIUM CHLORIDE 0.9 % IV SOLN
2.0000 g | INTRAVENOUS | Status: AC
  Administered 2018-08-12: 09:00:00 2 g via INTRAVENOUS

## 2018-08-12 MED ORDER — DOCUSATE SODIUM 100 MG PO CAPS: 100.0000 mg | ORAL_CAPSULE | Freq: Two times a day (BID) | ORAL | 2 refills | Status: AC

## 2018-08-12 MED ORDER — SUGAMMADEX SODIUM 200 MG/2ML IV SOLN
INTRAVENOUS | Status: DC | PRN
  Administered 2018-08-12: 200 mg via INTRAVENOUS

## 2018-08-12 MED ORDER — PROPOFOL 10 MG/ML IV BOLUS
INTRAVENOUS | Status: DC | PRN
  Administered 2018-08-12: 180 mg via INTRAVENOUS

## 2018-08-12 MED ORDER — MEPERIDINE HCL 50 MG/ML IJ SOLN: 6.2500 mg | INTRAMUSCULAR | Status: DC | PRN

## 2018-08-12 MED ORDER — LACTATED RINGERS IV SOLN
INTRAVENOUS | Status: DC
  Administered 2018-08-12: 09:00:00 via INTRAVENOUS

## 2018-08-12 MED ORDER — HEMOSTATIC AGENTS (NO CHARGE) OPTIME
TOPICAL | Status: DC | PRN
  Administered 2018-08-12 (×2): 1 via TOPICAL

## 2018-08-12 MED ORDER — HYDROCODONE-ACETAMINOPHEN 5-325 MG PO TABS: 1.0000 | ORAL_TABLET | ORAL | 0 refills | Status: DC | PRN

## 2018-08-12 MED ORDER — EPHEDRINE SULFATE 50 MG/ML IJ SOLN
INTRAMUSCULAR | Status: DC | PRN
  Administered 2018-08-12: 10 mg via INTRAVENOUS

## 2018-08-12 MED ORDER — HYDROMORPHONE HCL 1 MG/ML IJ SOLN
0.2500 mg | INTRAMUSCULAR | Status: DC | PRN
  Administered 2018-08-12 (×3): 0.5 mg via INTRAVENOUS
  Filled 2018-08-12 (×3): qty 0.5

## 2018-08-12 MED ORDER — LIDOCAINE 2% (20 MG/ML) 5 ML SYRINGE
INTRAMUSCULAR | Status: AC
  Filled 2018-08-12: qty 5

## 2018-08-12 MED ORDER — SODIUM CHLORIDE 0.9 % IR SOLN
Status: DC | PRN
  Administered 2018-08-12: 1000 mL

## 2018-08-12 MED ORDER — ROCURONIUM BROMIDE 10 MG/ML (PF) SYRINGE
PREFILLED_SYRINGE | INTRAVENOUS | Status: AC
  Filled 2018-08-12: qty 10

## 2018-08-12 MED ORDER — CHLORHEXIDINE GLUCONATE CLOTH 2 % EX PADS: 6.0000 | MEDICATED_PAD | Freq: Once | CUTANEOUS | Status: DC

## 2018-08-12 MED ORDER — SUCCINYLCHOLINE CHLORIDE 200 MG/10ML IV SOSY
PREFILLED_SYRINGE | INTRAVENOUS | Status: AC
  Filled 2018-08-12: qty 10

## 2018-08-12 MED ORDER — BUPIVACAINE HCL (PF) 0.5 % IJ SOLN
INTRAMUSCULAR | Status: AC
  Filled 2018-08-12: qty 30

## 2018-08-12 MED ORDER — HYDROCODONE-ACETAMINOPHEN 7.5-325 MG PO TABS: 1.0000 | ORAL_TABLET | Freq: Once | ORAL | Status: DC | PRN

## 2018-08-12 MED ORDER — FENTANYL CITRATE (PF) 100 MCG/2ML IJ SOLN
INTRAMUSCULAR | Status: AC
  Filled 2018-08-12: qty 2

## 2018-08-12 MED ORDER — HYDROCODONE-ACETAMINOPHEN 5-325 MG PO TABS: 1.0000 | ORAL_TABLET | ORAL | 0 refills | Status: AC | PRN

## 2018-08-12 MED ORDER — SODIUM CHLORIDE 0.9% FLUSH
INTRAVENOUS | Status: AC
  Filled 2018-08-12: qty 10

## 2018-08-12 MED ORDER — SODIUM CHLORIDE 0.9 % IV SOLN
INTRAVENOUS | Status: AC
  Filled 2018-08-12: qty 2

## 2018-08-12 MED ORDER — PROMETHAZINE HCL 25 MG/ML IJ SOLN
6.2500 mg | INTRAMUSCULAR | Status: DC | PRN
  Administered 2018-08-12: 12.5 mg via INTRAVENOUS
  Filled 2018-08-12: qty 1

## 2018-08-12 MED ORDER — BUPIVACAINE HCL (PF) 0.5 % IJ SOLN
INTRAMUSCULAR | Status: DC | PRN
  Administered 2018-08-12: 10 mL

## 2018-08-12 MED ORDER — ROCURONIUM 10MG/ML (10ML) SYRINGE FOR MEDFUSION PUMP - OPTIME
INTRAVENOUS | Status: DC | PRN
  Administered 2018-08-12: 10 mg via INTRAVENOUS
  Administered 2018-08-12: 30 mg via INTRAVENOUS

## 2018-08-12 MED ORDER — FENTANYL CITRATE (PF) 250 MCG/5ML IJ SOLN
INTRAMUSCULAR | Status: AC
  Filled 2018-08-12: qty 5

## 2018-08-12 MED ORDER — LIDOCAINE HCL (CARDIAC) PF 50 MG/5ML IV SOSY
PREFILLED_SYRINGE | INTRAVENOUS | Status: DC | PRN
  Administered 2018-08-12: 60 mg via INTRAVENOUS

## 2018-08-12 MED ORDER — SUCCINYLCHOLINE 20MG/ML (10ML) SYRINGE FOR MEDFUSION PUMP - OPTIME
INTRAMUSCULAR | Status: DC | PRN
  Administered 2018-08-12: 180 mg via INTRAVENOUS

## 2018-08-12 MED ORDER — LACTATED RINGERS IV SOLN: INTRAVENOUS | Status: DC

## 2018-08-12 MED ORDER — EPHEDRINE 5 MG/ML INJ
INTRAVENOUS | Status: AC
  Filled 2018-08-12: qty 10

## 2018-08-12 SURGICAL SUPPLY — 51 items
APPLICATOR ARISTA FLEXITIP XL (MISCELLANEOUS) ×2 IMPLANT
APPLICATOR COTTON TIP 6 STRL (MISCELLANEOUS) IMPLANT
APPLICATOR COTTON TIP 6IN STRL (MISCELLANEOUS) ×3
APPLIER CLIP ROT 10 11.4 M/L (STAPLE) ×3
BAG RETRIEVAL 10 (BASKET) ×1
BAG RETRIEVAL 10MM (BASKET) ×1
BLADE SURG 15 STRL LF DISP TIS (BLADE) ×1 IMPLANT
BLADE SURG 15 STRL SS (BLADE) ×2
CHLORAPREP W/TINT 26 (MISCELLANEOUS) ×3 IMPLANT
CLIP APPLIE ROT 10 11.4 M/L (STAPLE) ×1 IMPLANT
CLOTH BEACON ORANGE TIMEOUT ST (SAFETY) ×3 IMPLANT
COVER LIGHT HANDLE STERIS (MISCELLANEOUS) ×6 IMPLANT
COVER WAND RF STERILE (DRAPES) ×2 IMPLANT
DECANTER SPIKE VIAL GLASS SM (MISCELLANEOUS) ×3 IMPLANT
DERMABOND ADVANCED (GAUZE/BANDAGES/DRESSINGS) ×2
DERMABOND ADVANCED .7 DNX12 (GAUZE/BANDAGES/DRESSINGS) ×1 IMPLANT
ELECT REM PT RETURN 9FT ADLT (ELECTROSURGICAL) ×3
ELECTRODE REM PT RTRN 9FT ADLT (ELECTROSURGICAL) ×1 IMPLANT
FILTER SMOKE EVAC LAPAROSHD (FILTER) ×3 IMPLANT
GLOVE BIO SURGEON STRL SZ 6.5 (GLOVE) ×2 IMPLANT
GLOVE BIO SURGEONS STRL SZ 6.5 (GLOVE) ×1
GLOVE BIOGEL PI IND STRL 6.5 (GLOVE) ×1 IMPLANT
GLOVE BIOGEL PI IND STRL 7.0 (GLOVE) ×3 IMPLANT
GLOVE BIOGEL PI INDICATOR 6.5 (GLOVE) ×2
GLOVE BIOGEL PI INDICATOR 7.0 (GLOVE) ×6
GLOVE ECLIPSE 6.5 STRL STRAW (GLOVE) ×2 IMPLANT
GLOVE SURG SS PI 7.0 STRL IVOR (GLOVE) ×2 IMPLANT
GOWN STRL REUS W/TWL LRG LVL3 (GOWN DISPOSABLE) ×9 IMPLANT
HEMOSTAT ARISTA ABSORB 3G PWDR (HEMOSTASIS) ×2 IMPLANT
HEMOSTAT SNOW SURGICEL 2X4 (HEMOSTASIS) ×3 IMPLANT
INST SET LAPROSCOPIC AP (KITS) ×3 IMPLANT
KIT TURNOVER KIT A (KITS) ×3 IMPLANT
MANIFOLD NEPTUNE II (INSTRUMENTS) ×3 IMPLANT
NDL INSUFFLATION 14GA 120MM (NEEDLE) ×1 IMPLANT
NEEDLE INSUFFLATION 14GA 120MM (NEEDLE) ×3 IMPLANT
NS IRRIG 1000ML POUR BTL (IV SOLUTION) ×3 IMPLANT
PACK LAP CHOLE LZT030E (CUSTOM PROCEDURE TRAY) ×3 IMPLANT
PAD ARMBOARD 7.5X6 YLW CONV (MISCELLANEOUS) ×3 IMPLANT
SET BASIN LINEN APH (SET/KITS/TRAYS/PACK) ×3 IMPLANT
SLEEVE ENDOPATH XCEL 5M (ENDOMECHANICALS) ×3 IMPLANT
SUT MNCRL AB 4-0 PS2 18 (SUTURE) ×5 IMPLANT
SUT VICRYL 0 UR6 27IN ABS (SUTURE) ×3 IMPLANT
SYS BAG RETRIEVAL 10MM (BASKET) ×1
SYSTEM BAG RETRIEVAL 10MM (BASKET) ×1 IMPLANT
TROCAR ENDO BLADELESS 11MM (ENDOMECHANICALS) ×3 IMPLANT
TROCAR XCEL NON-BLD 5MMX100MML (ENDOMECHANICALS) ×3 IMPLANT
TROCAR XCEL UNIV SLVE 11M 100M (ENDOMECHANICALS) ×3 IMPLANT
TUBE CONNECTING 12'X1/4 (SUCTIONS) ×1
TUBE CONNECTING 12X1/4 (SUCTIONS) ×2 IMPLANT
TUBING INSUFFLATION (TUBING) ×3 IMPLANT
WARMER LAPAROSCOPE (MISCELLANEOUS) ×3 IMPLANT

## 2018-08-12 NOTE — Anesthesia Postprocedure Evaluation (Signed)
Anesthesia Post Note  Patient: Christophere Kimpson  Procedure(s) Performed: LAPAROSCOPIC CHOLECYSTECTOMY (N/A )  Patient location during evaluation: PACU Anesthesia Type: General Level of consciousness: awake and alert and patient cooperative Pain management: satisfactory to patient Vital Signs Assessment: post-procedure vital signs reviewed and stable Respiratory status: spontaneous breathing Cardiovascular status: stable Postop Assessment: no apparent nausea or vomiting Anesthetic complications: no     Last Vitals:  Vitals:   08/12/18 1100 08/12/18 1115  BP: 121/70 121/66  Pulse: 82 79  Resp: 16 12  Temp:    SpO2: 93% 94%    Last Pain:  Vitals:   08/12/18 1115  PainSc: 2                  Ricardo Schubach

## 2018-08-12 NOTE — Op Note (Signed)
Operative Note   Preoperative Diagnosis: Symptomatic cholelithiasis   Postoperative Diagnosis: Same   Procedure(s) Performed: Laparoscopic cholecystectomy   Surgeon: Lillia Abed C. Henreitta Leber, MD   Assistants: Franky Macho, MD    Anesthesia: General endotracheal   Anesthesiologist: Arbie Cookey, MD    Specimens: Gallbladder    Estimated Blood Loss: Minimal    Blood Replacement: None    Complications: None    Operative Findings: Distended gallbladder; intrahepatic    Procedure: The patient was taken to the operating room and placed supine. General endotracheal anesthesia was induced. Intravenous antibiotics were administered per protocol. An orogastric tube positioned to decompress the stomach. The abdomen was prepared and draped in the usual sterile fashion.    A supraumbilical  incision was made and a Veress technique was utilized to achieve pneumoperitoneum to 15 mmHg with carbon dioxide. A 11 mm optiview port was placed through the supraumbilical region, and a 10 mm 0-degree operative laparoscope was introduced. The area underlying the trocar and Veress needle were inspected and without evidence of injury.  Remaining trocars were placed under direct vision. Two 5 mm ports were placed in the right abdomen, between the anterior axillary and midclavicular line.  A final 11 mm port was placed through the mid-epigastrium, near the falciform ligament.  Omental attachments were taken down from the dome of the gallbladder.    The gallbladder fundus was elevated cephalad and the infundibulum was retracted to the patient's right. The gallbladder/cystic duct junction was skeletonized. The cystic artery noted in the triangle of Calot and was also skeletonized.  We then continued liberal medial and lateral dissection until the critical view of safety was achieved.    The cystic duct and cystic artery were doubly clipped and divided. The gallbladder was then dissected from the liver bed with  electrocautery. The specimen was placed in an Endopouch. Final inspection revealed acceptable hemostasis. Arista and Surgical Jamelle Haring were placed in the gallbladder bed. Due to COVID 19, the pneumoperitoneum was evacuated through the [facility] and then the Endopouch was removed from the epigastric site. All trocars were removed.    The epigastric and umbilical sites were smaller than my finger, so the fascia was not closed. Skin incisions were closed with 4-0 Monocryl subcuticular sutures and Dermabond. The patient was awakened from anesthesia and extubated without complication.    Algis Greenhouse, MD [facility] [address] [place], [place] [address] [phone] (office)

## 2018-08-12 NOTE — Transfer of Care (Signed)
Immediate Anesthesia Transfer of Care Note  Patient: Russell Richard  Procedure(s) Performed: LAPAROSCOPIC CHOLECYSTECTOMY (N/A )  Patient Location: PACU  Anesthesia Type:General  Level of Consciousness: awake and alert   Airway & Oxygen Therapy: Patient Spontanous Breathing and Patient connected to nasal cannula oxygen  Post-op Assessment: Report given to RN  Post vital signs: Reviewed and stable  Last Vitals:  Vitals Value Taken Time  BP    Temp    Pulse 84 08/12/2018 10:01 AM  Resp 19 08/12/2018 10:01 AM  SpO2 96 % 08/12/2018 10:01 AM  Vitals shown include unvalidated device data.  Last Pain:  Vitals:   08/12/18 0757  PainSc: 0-No pain         Complications: No apparent anesthesia complications

## 2018-08-12 NOTE — Interval H&P Note (Signed)
History and Physical Interval Note:  08/12/2018 8:21 AM  Russell Richard  has presented today for surgery, with the diagnosis of cholelithiasis.  The various methods of treatment have been discussed with the patient and family. After consideration of risks, benefits and other options for treatment, the patient has consented to  Procedure(s): LAPAROSCOPIC CHOLECYSTECTOMY (N/A) as a surgical intervention.  The patient's history has been reviewed, patient examined, no change in status, stable for surgery.  I have reviewed the patient's chart and labs.  Questions were answered to the patient's satisfaction.    No questions or changes.  Lucretia Roers

## 2018-08-12 NOTE — Discharge Instructions (Signed)
Discharge Laparoscopic Surgery Instructions:  Common Complaints: Right shoulder pain is common after laparoscopic surgery. This is secondary to the gas used in the surgery being trapped under the diaphragm.  Walk to help your body absorb the gas. This will improve in a few days. Pain at the port sites are common, especially the larger port sites. This will improve with time.  Some nausea is common and poor appetite. The main goal is to stay hydrated the first few days after surgery.   Diet/ Activity: Diet as tolerated. You may not have an appetite, but it is important to stay hydrated. Drink 64 ounces of water a day. Your appetite will return with time.  Shower per your regular routine daily.  Do not take hot showers. Take warm showers that are less than 10 minutes. Rest and listen to your body, but do not remain in bed all day.  Walk everyday for at least 15-20 minutes. Deep cough and move around every 1-2 hours in the first few days after surgery.  Do not lift > 10 lbs, perform excessive bending, pushing, pulling, squatting for 1-2 weeks after surgery.  Do not pick at the dermabond glue on your incision sites.  This glue film will remain in place for 1-2 weeks and will start to peel off.  Do not place lotions or balms on your incision unless instructed to specifically by Dr. Henreitta LeberBridges.   Medication: Take tylenol and ibuprofen as needed for pain control, alternating every 4-6 hours.  Take Norco for breakthrough pain but remember it has tylenol in it (every pill has 325mg  of tylenol). Do not exceed 4000mg  (4grams) of tylenol a day.  Take Colace for constipation related to narcotic pain medication. If you do not have a bowel movement in 2 days, take Miralax over the counter.  Drink plenty of water to also prevent constipation.   Contact Information: If you have questions or concerns, please call our office, [phone], Monday- Thursday 8AM-5PM and Friday 8AM-12Noon.  If it is after hours or  on the weekend, please call [facility], [phone], and ask to speak to the surgeon on call for Dr. Henreitta LeberBridges at [facility].    Laparoscopic Cholecystectomy, Care After This sheet gives you information about how to care for yourself after your procedure. Your doctor may also give you more specific instructions. If you have problems or questions, contact your doctor. Follow these instructions at home: Care for cuts from surgery (incisions)   Follow instructions from your doctor about how to take care of your cuts from surgery. Make sure you: ? Wash your hands with soap and water before you change your bandage (dressing). If you cannot use soap and water, use hand sanitizer. ? Change your bandage as told by your doctor. ? Leave stitches (sutures), skin glue, or skin tape (adhesive) strips in place. They may need to stay in place for 2 weeks or longer. If tape strips get loose and curl up, you may trim the loose edges. Do not remove tape strips completely unless your doctor says it is okay.  Do not take baths, swim, or use a hot tub until your doctor says it is okay.   You may shower.  Check your surgical cut area every day for signs of infection. Check for: ? More redness, swelling, or pain. ? More fluid or blood. ? Warmth. ? Pus or a bad smell. Activity  Do not drive or use heavy machinery while taking prescription pain medicine.  Do not  lift anything that is heavier than 10 lb (4.5 kg) until your doctor says it is okay.  Do not play contact sports until your doctor says it is okay.  Do not drive for 24 hours if you were given a medicine to help you relax (sedative).  Rest as needed. Do not return to work or school until your doctor says it is okay. General instructions  Take over-the-counter and prescription medicines only as told by your doctor.  To prevent or treat constipation while you are taking prescription pain medicine, your doctor may recommend that  you: ? Drink enough fluid to keep your pee (urine) clear or pale yellow. ? Take over-the-counter or prescription medicines. ? Eat foods that are high in fiber, such as fresh fruits and vegetables, whole grains, and beans. ? Limit foods that are high in fat and processed sugars, such as fried and sweet foods. Contact a doctor if:  You develop a rash.  You have more redness, swelling, or pain around your surgical cuts.  You have more fluid or blood coming from your surgical cuts.  Your surgical cuts feel warm to the touch.  You have pus or a bad smell coming from your surgical cuts.  You have a fever.  One or more of your surgical cuts breaks open. Get help right away if:  You have trouble breathing.  You have chest pain.  You have pain that is getting worse in your shoulders.  You faint or feel dizzy when you stand.  You have very bad pain in your belly (abdomen).  You are sick to your stomach (nauseous) for more than one day.  You have throwing up (vomiting) that lasts for more than one day.  You have leg pain. This information is not intended to replace advice given to you by your health care provider. Make sure you discuss any questions you have with your health care provider. Document Released: 12/03/2007 Document Revised: 09/14/2015 Document Reviewed: 08/12/2015 Elsevier Interactive Patient Education  2019 [employer]  PATIENT INSTRUCTIONS POST-ANESTHESIA  IMMEDIATELY FOLLOWING SURGERY:  Do not drive or operate machinery for the first twenty four hours after surgery.  Do not make any important decisions for twenty four hours after surgery or while taking narcotic pain medications or sedatives.  If you develop intractable nausea and vomiting or a severe headache please notify your doctor immediately.  FOLLOW-UP:  Please make an appointment with your surgeon as instructed. You do not need to follow up with anesthesia unless specifically instructed to do so.  WOUND  CARE INSTRUCTIONS (if applicable):  Keep a dry clean dressing on the anesthesia/puncture wound site if there is drainage.  Once the wound has quit draining you may leave it open to air.  Generally you should leave the bandage intact for twenty four hours unless there is drainage.  If the epidural site drains for more than 36-48 hours please call the anesthesia department.  QUESTIONS?:  Please feel free to call your physician or the hospital operator if you have any questions, and they will be happy to assist you.

## 2018-08-12 NOTE — Anesthesia Preprocedure Evaluation (Signed)
Anesthesia Evaluation    Airway Mallampati: II       Dental  (+) Caps, Implants, Teeth Intact, Partial Lower, Partial Upper   Pulmonary former smoker,    breath sounds clear to auscultation       Cardiovascular  Rhythm:regular     Neuro/Psych  Headaches, CVA    GI/Hepatic GERD  ,  Endo/Other    Renal/GU      Musculoskeletal   Abdominal   Peds  Hematology   Anesthesia Other Findings States no interval changes following CVA in 2017  Reproductive/Obstetrics                             Anesthesia Physical Anesthesia Plan  ASA: III  Anesthesia Plan: General   Post-op Pain Management:    Induction:   PONV Risk Score and Plan:   Airway Management Planned:   Additional Equipment:   Intra-op Plan:   Post-operative Plan:   Informed Consent: I have reviewed the patients History and Physical, chart, labs and discussed the procedure including the risks, benefits and alternatives for the proposed anesthesia with the patient or authorized representative who has indicated his/her understanding and acceptance.       Plan Discussed with: Anesthesiologist  Anesthesia Plan Comments:         Anesthesia Quick Evaluation

## 2018-08-12 NOTE — Progress Notes (Signed)
[facility]  Called his wife and notified her that surgery was completed. Plan for post op telephone visit.   Algis Greenhouse, MD [facility] [address] [place], [place] [address] [phone] (office)

## 2018-08-12 NOTE — Anesthesia Procedure Notes (Signed)
Procedure Name: Intubation Date/Time: 08/12/2018 8:45 AM Performed by: Ollen Bowl, CRNA Pre-anesthesia Checklist: Patient identified, Patient being monitored, Timeout performed, Emergency Drugs available and Suction available Patient Re-evaluated:Patient Re-evaluated prior to induction Oxygen Delivery Method: Circle system utilized Preoxygenation: Pre-oxygenation with 100% oxygen Induction Type: IV induction Ventilation: Mask ventilation without difficulty Laryngoscope Size: Mac and 3 Grade View: Grade I Tube type: Oral Tube size: 7.0 mm Number of attempts: 1 Airway Equipment and Method: Stylet Placement Confirmation: ETT inserted through vocal cords under direct vision,  positive ETCO2 and breath sounds checked- equal and bilateral Secured at: 23 cm Tube secured with: Tape Dental Injury: Teeth and Oropharynx as per pre-operative assessment

## 2018-08-15 ENCOUNTER — Encounter (HOSPITAL_COMMUNITY): Payer: Self-pay | Admitting: General Surgery

## 2018-08-30 ENCOUNTER — Other Ambulatory Visit: Payer: Self-pay

## 2018-08-30 ENCOUNTER — Telehealth (INDEPENDENT_AMBULATORY_CARE_PROVIDER_SITE_OTHER): Payer: Self-pay | Admitting: General Surgery

## 2018-08-30 DIAGNOSIS — K8020 Calculus of gallbladder without cholecystitis without obstruction: Secondary | ICD-10-CM

## 2018-08-30 NOTE — Progress Notes (Signed)
[facility]  I am calling the patient for post operative evaluation due to the current COVID 19 pandemic.  The patient had a laparoscopic cholecystectomy on 08/12/18. The patient reports that they are doing well. The are tolerating a diet and his appetite is coming back, having good pain control, and having regular Bms.  The patient has no concerns. Incisions are healing up and the glue is peeling.  He saw his PCP last week and had some blood work and he does not have diabetes.  He is going back tomorrow to his PCP. He says he is feeling better overall.   Will see the patient PRN.   Pathology: Diagnosis Gallbladder - CHRONIC CHOLECYSTITIS AND CHOLELITHIASIS  Curlene Labrum, MD [facility] [address] [place], [place] [address] [phone] (office)

## 2018-10-17 ENCOUNTER — Other Ambulatory Visit: Payer: Self-pay | Admitting: General Surgery

## 2018-11-23 ENCOUNTER — Other Ambulatory Visit: Payer: Self-pay | Admitting: General Surgery

## 2021-04-22 IMAGING — CT CT ABDOMEN AND PELVIS WITH CONTRAST
2 of 5 series · 17 of 46 positions shown, 19 images · IV contrast (Isovue)
Comparison: None.

CLINICAL DATA: RIGHT-sided abdominal pain

EXAM:
CT ABDOMEN AND PELVIS WITH CONTRAST
TECHNIQUE: Multidetector CT imaging of the abdomen and pelvis was performed
using the standard protocol following bolus administration of
intravenous contrast.
CONTRAST:  100mL OMNIPAQUE IOHEXOL 300 MG/ML  SOLN

[Series 2: axial st · axial · 0.85mm/px · z∈[+680,+1085]mm · 14 of 95 slices shown, 16 images]
[im 7/95  soft-tissue]
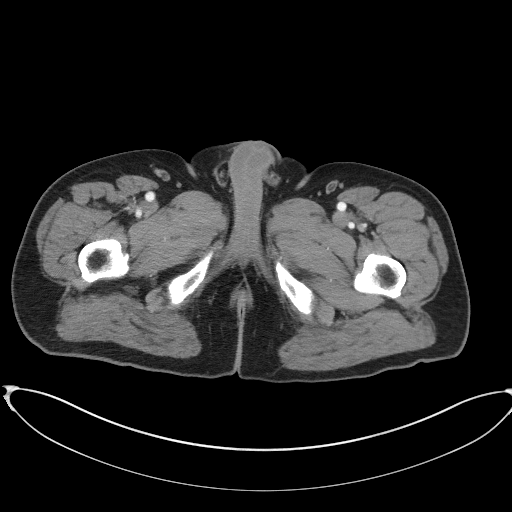
[im 7/95  bone]
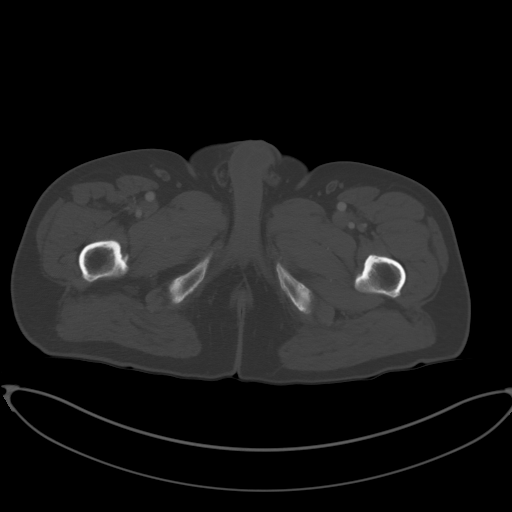
[im 13/95  soft-tissue]
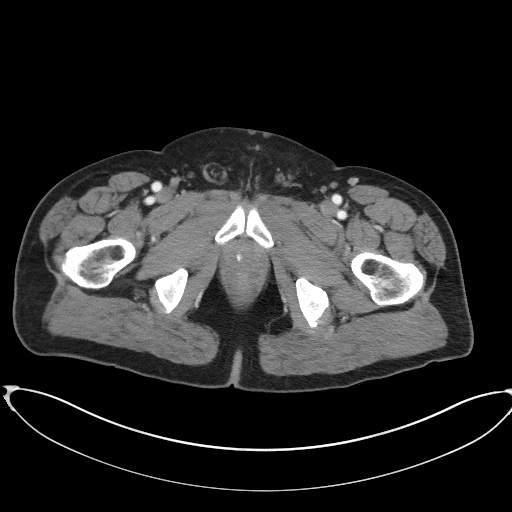
[im 19/95  soft-tissue]
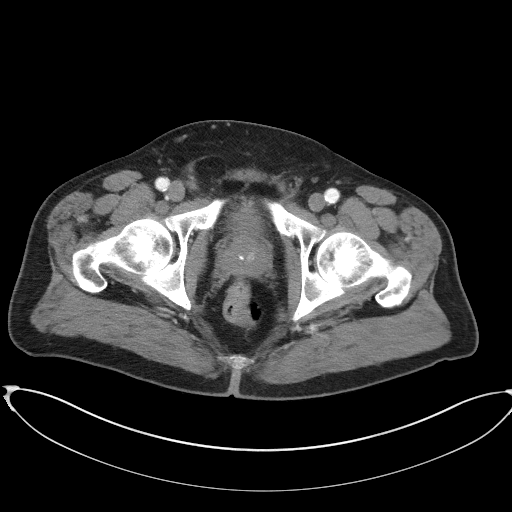
[im 26/95  soft-tissue]
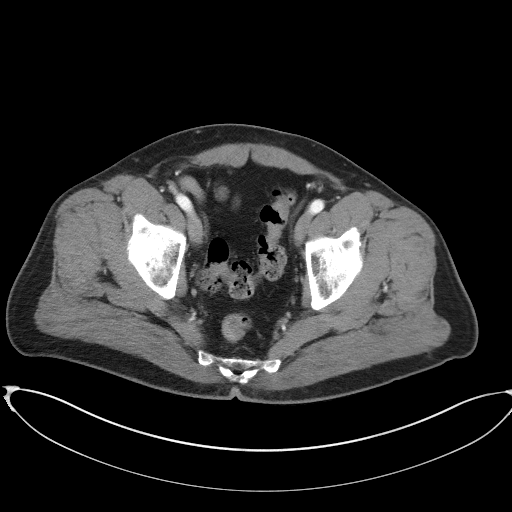
[im 32/95  soft-tissue]
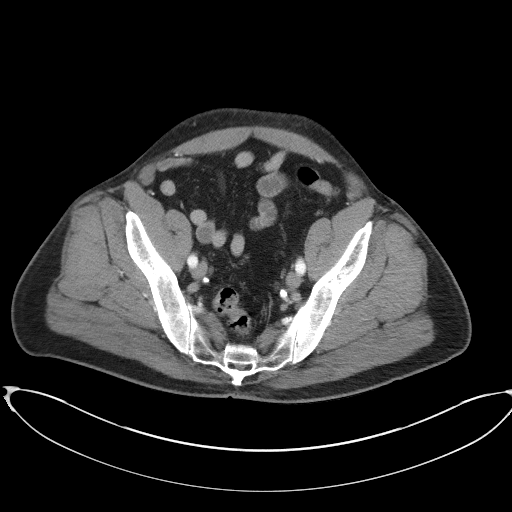
[im 38/95  soft-tissue]
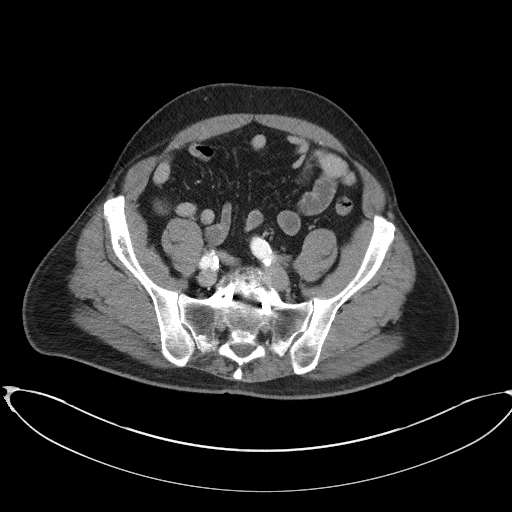
[im 44/95  soft-tissue]
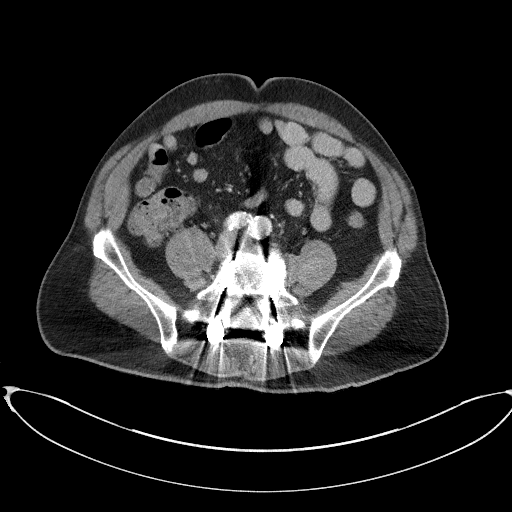
[im 51/95  soft-tissue]
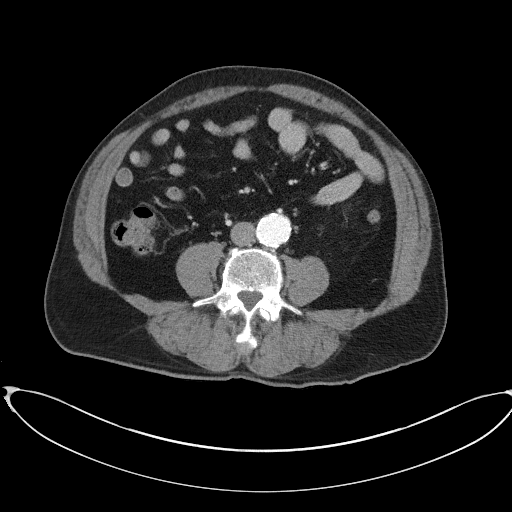
[im 57/95  soft-tissue]
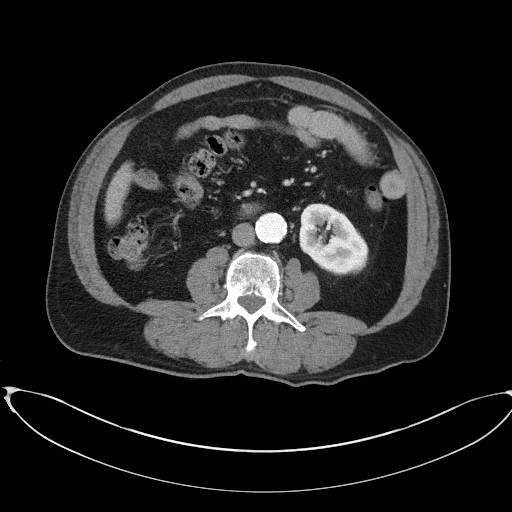
[im 57/95  bone]
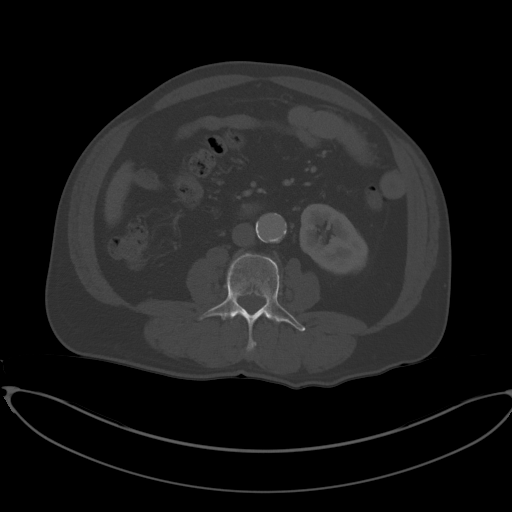
[im 63/95  soft-tissue]
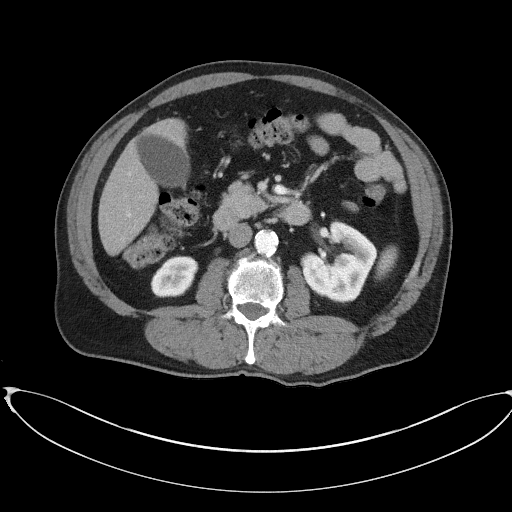
[im 69/95  soft-tissue]
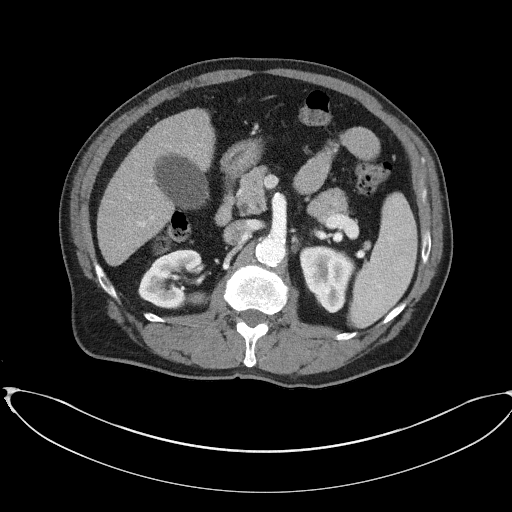
[im 76/95  soft-tissue]
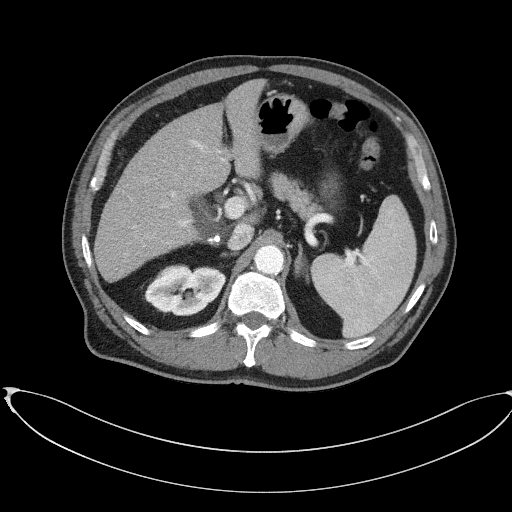
[im 82/95  soft-tissue]
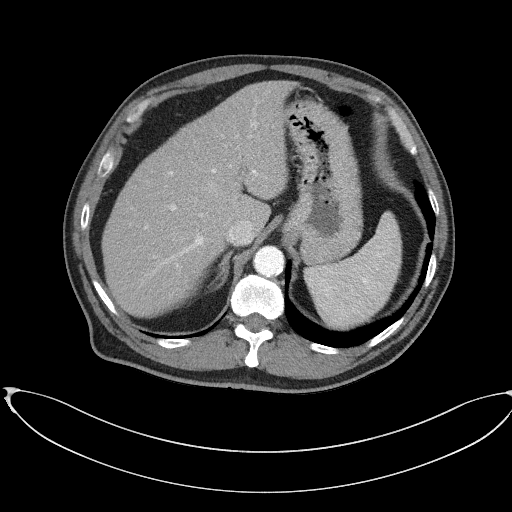
[im 88/95  soft-tissue]
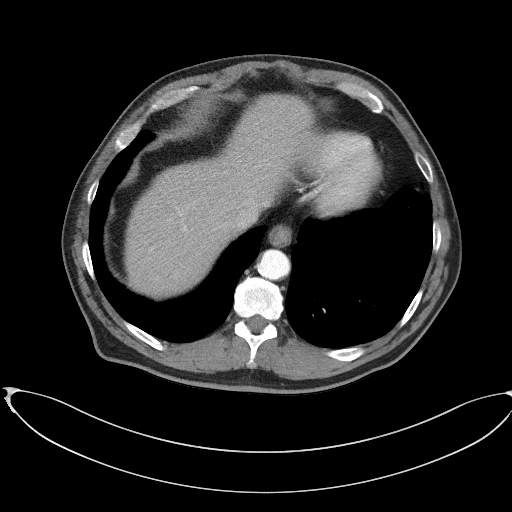

[Series 5: coronal st · coronal · 0.83mm/px · 3 of 109 slices shown]
[im 37/109  soft-tissue]
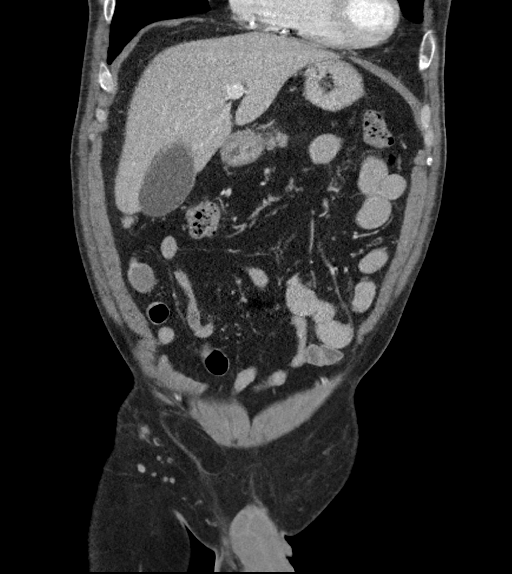
[im 49/109  soft-tissue]
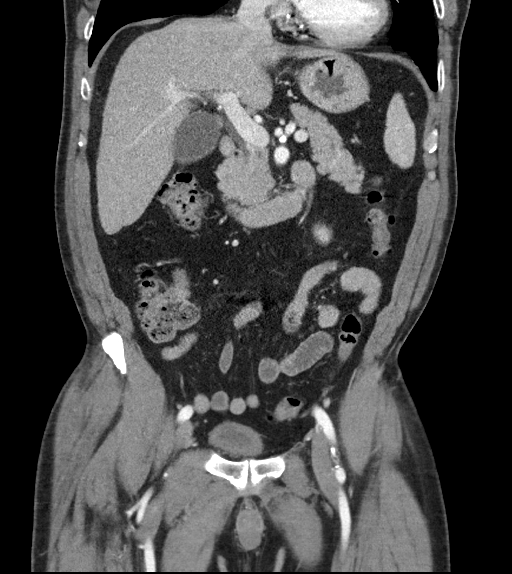
[im 61/109  soft-tissue]
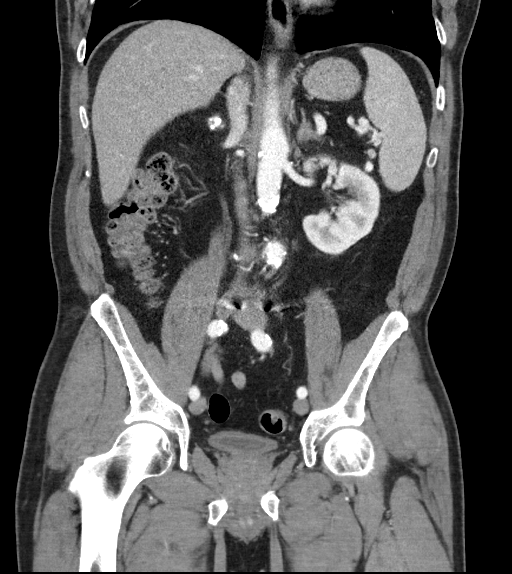

[17 of 46 positions shown; findings below may reference images not displayed]

FINDINGS: Lower chest: Lung bases are clear.

Hepatobiliary: No focal hepatic lesion. The several round gallstones
in lumen of the gallbladder. No biliary duct dilatation. No
inflammation.

Pancreas: Pancreas is normal. No ductal dilatation. No pancreatic
inflammation.

Spleen: Normal spleen

Adrenals/urinary tract: Adrenal glands and kidneys are normal. The
ureters and bladder normal.

Stomach/Bowel: Stomach, small-bowel and cecum are normal. The
appendix is not identified but there is no pericecal inflammation to
suggest appendicitis. The colon and rectosigmoid colon are normal.

Vascular/Lymphatic: Abdominal aorta is normal caliber with
atherosclerotic calcification. Common origin of the SMA and celiac
trunk. There is no retroperitoneal or periportal lymphadenopathy. No
pelvic lymphadenopathy.

Reproductive: Prostate normal

Other: No free fluid.

Musculoskeletal: Posterior lumbar fusion.  No complicating features
IMPRESSION: 1. No acute findings in the abdomen pelvis.
2. Gallstones without evidence of cholecystitis.
3. Appendix not identified.  No secondary signs of appendicitis.
4.  Aortic Atherosclerosis ([id number]).
